# Patient Record
Sex: Male | Born: 1940 | Race: Black or African American | Hispanic: No | Marital: Single | Smoking: Current every day smoker
Health system: Southern US, Community
[De-identification: ages and names within clinical notes are randomized; demographics above are authoritative.]

## PROBLEM LIST (undated history)

## (undated) DIAGNOSIS — E782 Mixed hyperlipidemia: Secondary | ICD-10-CM

## (undated) DIAGNOSIS — K649 Unspecified hemorrhoids: Secondary | ICD-10-CM

## (undated) DIAGNOSIS — R7611 Nonspecific reaction to tuberculin skin test without active tuberculosis: Secondary | ICD-10-CM

## (undated) DIAGNOSIS — F79 Unspecified intellectual disabilities: Secondary | ICD-10-CM

## (undated) DIAGNOSIS — K5790 Diverticulosis of intestine, part unspecified, without perforation or abscess without bleeding: Secondary | ICD-10-CM

## (undated) DIAGNOSIS — R918 Other nonspecific abnormal finding of lung field: Secondary | ICD-10-CM

## (undated) DIAGNOSIS — C3491 Malignant neoplasm of unspecified part of right bronchus or lung: Secondary | ICD-10-CM

## (undated) DIAGNOSIS — F209 Schizophrenia, unspecified: Secondary | ICD-10-CM

## (undated) DIAGNOSIS — Z8719 Personal history of other diseases of the digestive system: Secondary | ICD-10-CM

## (undated) DIAGNOSIS — I10 Essential (primary) hypertension: Secondary | ICD-10-CM

## (undated) DIAGNOSIS — E119 Type 2 diabetes mellitus without complications: Secondary | ICD-10-CM

## (undated) DIAGNOSIS — I493 Ventricular premature depolarization: Secondary | ICD-10-CM

## (undated) DIAGNOSIS — R55 Syncope and collapse: Secondary | ICD-10-CM

## (undated) HISTORY — DX: Ventricular premature depolarization: I49.3

## (undated) HISTORY — DX: Unspecified hemorrhoids: K64.9

## (undated) HISTORY — DX: Malignant neoplasm of unspecified part of right bronchus or lung: C34.91

## (undated) HISTORY — PX: OTHER SURGICAL HISTORY: SHX169

## (undated) HISTORY — DX: Unspecified intellectual disabilities: F79

## (undated) HISTORY — DX: Essential (primary) hypertension: I10

## (undated) HISTORY — DX: Schizophrenia, unspecified: F20.9

## (undated) HISTORY — DX: Type 2 diabetes mellitus without complications: E11.9

## (undated) HISTORY — DX: Syncope and collapse: R55

## (undated) HISTORY — DX: Nonspecific reaction to tuberculin skin test without active tuberculosis: R76.11

## (undated) HISTORY — DX: Diverticulosis of intestine, part unspecified, without perforation or abscess without bleeding: K57.90

## (undated) HISTORY — DX: Other nonspecific abnormal finding of lung field: R91.8

## (undated) HISTORY — DX: Personal history of other diseases of the digestive system: Z87.19

## (undated) HISTORY — DX: Mixed hyperlipidemia: E78.2

---

## 2004-01-23 ENCOUNTER — Ambulatory Visit (HOSPITAL_COMMUNITY): Admission: RE | Admit: 2004-01-23 | Discharge: 2004-01-23 | Payer: Self-pay | Admitting: Cardiology

## 2004-07-10 ENCOUNTER — Inpatient Hospital Stay (HOSPITAL_COMMUNITY): Admission: EM | Admit: 2004-07-10 | Discharge: 2004-07-12 | Payer: Self-pay | Admitting: Emergency Medicine

## 2004-07-10 ENCOUNTER — Ambulatory Visit: Payer: Self-pay | Admitting: Internal Medicine

## 2004-07-11 HISTORY — PX: COLONOSCOPY: SHX174

## 2004-07-11 HISTORY — PX: ESOPHAGOGASTRODUODENOSCOPY: SHX1529

## 2004-12-19 ENCOUNTER — Ambulatory Visit: Payer: Self-pay | Admitting: Internal Medicine

## 2004-12-20 ENCOUNTER — Encounter (HOSPITAL_COMMUNITY): Admission: RE | Admit: 2004-12-20 | Discharge: 2004-12-21 | Payer: Self-pay | Admitting: Internal Medicine

## 2004-12-20 ENCOUNTER — Ambulatory Visit (HOSPITAL_COMMUNITY): Payer: Self-pay | Admitting: Internal Medicine

## 2005-01-10 ENCOUNTER — Ambulatory Visit (HOSPITAL_COMMUNITY): Admission: RE | Admit: 2005-01-10 | Discharge: 2005-01-10 | Payer: Self-pay | Admitting: Internal Medicine

## 2005-01-10 ENCOUNTER — Ambulatory Visit: Payer: Self-pay | Admitting: Internal Medicine

## 2005-01-10 HISTORY — PX: GIVENS CAPSULE STUDY: SHX5432

## 2005-01-23 ENCOUNTER — Ambulatory Visit: Payer: Self-pay | Admitting: Internal Medicine

## 2008-03-29 ENCOUNTER — Ambulatory Visit (HOSPITAL_COMMUNITY): Admission: RE | Admit: 2008-03-29 | Discharge: 2008-03-29 | Payer: Self-pay | Admitting: Gastroenterology

## 2008-03-29 ENCOUNTER — Ambulatory Visit: Payer: Self-pay | Admitting: Gastroenterology

## 2008-03-29 HISTORY — PX: COLONOSCOPY: SHX174

## 2008-08-24 ENCOUNTER — Ambulatory Visit (HOSPITAL_COMMUNITY): Admission: RE | Admit: 2008-08-24 | Discharge: 2008-08-24 | Payer: Self-pay | Admitting: Family Medicine

## 2009-01-01 ENCOUNTER — Emergency Department (HOSPITAL_COMMUNITY): Admission: EM | Admit: 2009-01-01 | Discharge: 2009-01-01 | Payer: Self-pay | Admitting: Emergency Medicine

## 2009-05-11 ENCOUNTER — Emergency Department (HOSPITAL_COMMUNITY): Admission: EM | Admit: 2009-05-11 | Discharge: 2009-05-11 | Payer: Self-pay | Admitting: Emergency Medicine

## 2009-11-07 ENCOUNTER — Ambulatory Visit: Payer: Self-pay | Admitting: Cardiology

## 2009-11-07 DIAGNOSIS — I10 Essential (primary) hypertension: Secondary | ICD-10-CM

## 2009-11-07 DIAGNOSIS — I4949 Other premature depolarization: Secondary | ICD-10-CM | POA: Insufficient documentation

## 2009-11-07 DIAGNOSIS — Z87898 Personal history of other specified conditions: Secondary | ICD-10-CM | POA: Insufficient documentation

## 2009-11-08 ENCOUNTER — Encounter: Payer: Self-pay | Admitting: Cardiology

## 2009-11-14 ENCOUNTER — Ambulatory Visit (HOSPITAL_COMMUNITY): Admission: RE | Admit: 2009-11-14 | Discharge: 2009-11-14 | Payer: Self-pay | Admitting: Cardiology

## 2009-11-14 ENCOUNTER — Ambulatory Visit: Payer: Self-pay | Admitting: Cardiology

## 2009-11-14 ENCOUNTER — Encounter: Payer: Self-pay | Admitting: Cardiology

## 2009-11-16 ENCOUNTER — Ambulatory Visit: Payer: Self-pay | Admitting: Cardiology

## 2009-12-14 ENCOUNTER — Ambulatory Visit: Payer: Self-pay | Admitting: Cardiology

## 2010-07-04 ENCOUNTER — Ambulatory Visit
Admission: RE | Admit: 2010-07-04 | Discharge: 2010-07-04 | Payer: Self-pay | Source: Home / Self Care | Attending: Cardiology | Admitting: Cardiology

## 2010-07-05 ENCOUNTER — Encounter: Payer: Self-pay | Admitting: Adult Health

## 2010-07-11 NOTE — Letter (Signed)
Summary: PROGRESS NOTE [facility] 5.3.11  PROGRESS NOTE [facility] 5.3.11   Imported By: Faythe Ghee 11/08/2009 09:53:56  _____________________________________________________________________  External Attachment:    Type:   Image     Comment:   External Document

## 2010-07-11 NOTE — Letter (Signed)
Summary: LABS 5.4.11  LABS 5.4.11   Imported By: Faythe Ghee 11/08/2009 09:54:30  _____________________________________________________________________  External Attachment:    Type:   Image     Comment:   External Document

## 2010-07-11 NOTE — Assessment & Plan Note (Signed)
Summary: per Dr. Casilda Carls  for hx of DM/HTN/HL/tobacco abuse/tg   Visit Type:  Initial Consult Primary Provider:  Dr. Forest Gleason   History of Present Illness: 70 year old Alejandro [name] referred for cardiology consultation. He has apparently seen Dr. Dietrich Pates in the past based on limited information. He is referred back to the office with a fairly recent episode of syncope. The patient resides at a care facility and has assistance with his medications and other needs. Nearly one month ago, he states that he got out of bed and was ambulating when he suddenly "fell down" and "could not get my memory back" for about "10 seconds." He does not endorse any specific chest pain or palpitations at that time, and denies any obvious sense of lightheadedness preceding this event. He has had no further episodes since that time.  Mr. Brau denies having any prior episodes of syncope. I see no previous documentation of cardiac dysrhythmia or ischemic heart disease, although he certainly has ongoing risk factors. He underwent a Myoview back in August of 2005 that demonstrated an LVEF of 51% with normal myocardial perfusion in the setting of diaphragmatic attenuation.   Mr. Sebo denies having any reproducible exertional chest pain with regular activity. He states the only discomfort he feels as when he "eats too much" he experiences a distended feeling in his abdomen and lower chest. He has no regular sense of palpitations or intermittent dizziness.   Preventive Screening-Counseling & Management  Alcohol-Tobacco     Smoking Status: current  Current Medications (verified): 1)  Risperdal 3 Mg Tabs (Risperidone) .... Take 1 Tab Two Times A Day 2)  Flomax 0.4 Mg Caps (Tamsulosin Hcl) .... Take 1 Tab Daily 3)  Prilosec 20 Mg Cpdr (Omeprazole) .... Take 2 Caps Daily 4)  Avodart 0.5 Mg Caps (Dutasteride) .... Take 1 Tab Daily 5)  Lisinopril-Hydrochlorothiazide 20-12.5 Mg Tabs (Lisinopril-Hydrochlorothiazide) .... Take 1 Tab  Daily 6)  Norvasc 5 Mg Tabs (Amlodipine Besylate) .... Take 1 Tab Daily 7)  Aspir-Low 81 Mg Tbec (Aspirin) .... Take 1 Tab Daily 8)  Metformin Hcl 500 Mg Tabs (Metformin Hcl) .... Take 1 Tab Two Times A Day 9)  Metamucil Multi[employer] Fiber 58.6 % Powd (Psyllium) .Marland Kitchen.. 1 Teaspoon Ful Two Times A Day 10)  Mevacor 40 Mg Tabs (Lovastatin) .... Take 2 Tabs Bedtime 11)  Ventolin Hfa 108 (90 Base) Mcg/act Aers (Albuterol Sulfate) .... 2 Puffs Q4-6hrs or As Needed 12)  Tylenol 325 Mg Tabs (Acetaminophen) .... Take 2 Tabs Q 6hrs As Needed For Pain  Allergies (verified): No Known Drug Allergies  Past History:  Family History: Last updated: 11/07/2009 Cerebrovascular and cardiovascular disease  Social History: Last updated: 11/07/2009 [facility] rest home Retired - Copy Tobacco Use - Yes Alcohol Use - no Prior use of crack cocaine  Past Medical History: Hyperlipidemia Hypertension Schizophrenia History of positive PPD - treated for tuberculosis Diabetes Type 2 Pancolonic diverticulosis Hemorrhoidal and diverticular bleeding  Past Surgical History: Unremarkable  Family History: Cerebrovascular and cardiovascular disease  Social History: [facility] rest home Retired Teacher, music Tobacco Use - Yes Alcohol Use - no Prior use of crack cocaine Smoking Status:  current  Review of Systems  The patient denies anorexia, fever, chest pain, dyspnea on exertion, peripheral edema, prolonged cough, headaches, hemoptysis, melena, and hematochezia.         Otherwise reviewed and negative except as already outlined.  Vital Signs:  Patient profile:   70 year old Alejandro [name] Height:  68 inches Weight:      208 pounds BMI:     31.74 Pulse rate:   85 / minute BP sitting:   122 / 71  (right arm)  Vitals Entered By: Dreama Saa, CNA (Nov 07, 2009 1:12 PM)  Physical Exam  Additional Exam:  Obese Alejandro [name] in no acute distress. HEENT: Conjunctiva and lids normal, oropharynx with poor  dentition. Neck: Supple, elevated JVP or carotid bruits. Lungs: Clear to auscultation, nonlabored. Cardiac: Regular rate and rhythm, soft S4, no S3, no pericardial rub. Abdomen: Protuberant, obese, unable to easily palpate liver edge, bowel sounds present, no tenderness. Extremities: No pitting edema, distal pulses 1+. Skin: Warm and dry. Musculoskeletal: No gross deformities. Neuropsychiatric: Alert and oriented x3. Affect grossly appropriate to situation.   Impression & Recommendations:  Problem # 1:  SYNCOPE AND COLLAPSE (ICD-780.2)  Single episode of apparent syncope based on patient description. This reportedly occurred after standing up from a supine position. Transient orthostasis is a possibility, as is cardiac dysrhythmia. Mr. Hilligoss has risk factors for underlying ischemic heart disease, although does not endorse any regular anginal symptoms or unusual shortness of breath. Today's electrocardiogram does show frequent PVCs. We discussed these issues, and the plan at this point will be to proceed with a 2-D echocardiogram to document cardiac structure and function, and also place a 24-hour Holter monitor. I will then have him return to the office to discuss the results, and any potential additional evaluation. At this point no specific medication adjustments were made.  His updated medication list for this problem includes:    Lisinopril-hydrochlorothiazide 20-12.5 Mg Tabs (Lisinopril-hydrochlorothiazide) .Marland Kitchen... Take 1 tab daily    Norvasc 5 Mg Tabs (Amlodipine besylate) .Marland Kitchen... Take 1 tab daily    Aspir-low 81 Mg Tbec (Aspirin) .Marland Kitchen... Take 1 tab daily  Orders: Holter Monitor (Holter Monitor) 2-D Echocardiogram (2D Echo)  Problem # 2:  OTHER PREMATURE BEATS (ICD-427.69)  PVCs noted on electrocardiogram. Available old tracings also show at least occasional PVCs.  His updated medication list for this problem includes:    Lisinopril-hydrochlorothiazide 20-12.5 Mg Tabs  (Lisinopril-hydrochlorothiazide) .Marland Kitchen... Take 1 tab daily    Norvasc 5 Mg Tabs (Amlodipine besylate) .Marland Kitchen... Take 1 tab daily    Aspir-low 81 Mg Tbec (Aspirin) .Marland Kitchen... Take 1 tab daily  Orders: 2-D Echocardiogram (2D Echo) Holter Monitor (Holter Monitor)  Problem # 3:  HYPERTENSION, BENIGN ESSENTIAL (ICD-401.1)  Blood pressure well controlled today.  His updated medication list for this problem includes:    Lisinopril-hydrochlorothiazide 20-12.5 Mg Tabs (Lisinopril-hydrochlorothiazide) .Marland Kitchen... Take 1 tab daily    Norvasc 5 Mg Tabs (Amlodipine besylate) .Marland Kitchen... Take 1 tab daily    Aspir-low 81 Mg Tbec (Aspirin) .Marland Kitchen... Take 1 tab daily  Patient Instructions: 1)  Your physician recommends that you schedule a follow-up appointment in: 3 to 4 weeks 2)  Your physician recommends that you continue on your current medications as directed. Please refer to the Current Medication list given to you today. 3)  Your physician has requested that you have an echocardiogram.  Echocardiography is a painless test that uses sound waves to create images of your heart. It provides your doctor with information about the size and shape of your heart and how well your heart's chambers and valves are working.  This procedure takes approximately one hour. There are no restrictions for this procedure. 4)  Your physician has recommended that you wear a holter monitor.  Holter monitors are medical devices that record the heart's electrical activity.  Doctors most often use these monitors to diagnose arrhythmias. Arrhythmias are problems with the speed or rhythm of the heartbeat. The monitor is a small, portable device. You can wear one while you do your normal daily activities. This is usually used to diagnose what is causing palpitations/syncope (passing out).

## 2010-07-11 NOTE — Letter (Signed)
Summary: EKG 5.3.11  EKG 5.3.11   Imported By: Faythe Ghee 11/08/2009 09:55:19  _____________________________________________________________________  External Attachment:    Type:   Image     Comment:   External Document

## 2010-07-11 NOTE — Assessment & Plan Note (Signed)
Summary: 3-4 wk f/u per checkout on 11/07/09/tg   Visit Type:  Follow-up Primary Provider:  Dr. Forest Gleason   History of Present Illness: 70 year old male presents for followup. He was seen recently in late May for evaluation of syncope. Subsequent testing including echocardiogram and Holter monitor are reviewed below.  Since his last visit, he has had no syncope, no falls, no sense of palpitations, and no dizziness. Furthermore, he denies any exertional chest pain to suggest angina.  I reviewed the findings of his results today, and we discussed the implications. While we did not see any sustained ventricular events that would explain syncope, this remains a possibility. In light of his low normal LVEF, medical therapy and observation seem to make the most sense at this time. He was comfortable with this. He lives in a rest home, and does not drive.  Current Medications (verified): 1)  Risperdal 3 Mg Tabs (Risperidone) .... Take 1 Tab Two Times A Day 2)  Flomax 0.4 Mg Caps (Tamsulosin Hcl) .... Take 1 Tab Daily 3)  Prilosec 20 Mg Cpdr (Omeprazole) .... Take 2 Caps Daily 4)  Avodart 0.5 Mg Caps (Dutasteride) .... Take 1 Tab Daily 5)  Lisinopril-Hydrochlorothiazide 20-12.5 Mg Tabs (Lisinopril-Hydrochlorothiazide) .... Take 1 Tab Daily 6)  Norvasc 5 Mg Tabs (Amlodipine Besylate) .... Take 1 Tab Daily 7)  Aspir-Low 81 Mg Tbec (Aspirin) .... Take 1 Tab Daily 8)  Metformin Hcl 500 Mg Tabs (Metformin Hcl) .... Take 1 Tab Two Times A Day 9)  Metamucil Multihealth Fiber 58.6 % Powd (Psyllium) .Marland Kitchen.. 1 Teaspoon Ful Two Times A Day 10)  Mevacor 40 Mg Tabs (Lovastatin) .... Take 2 Tabs Bedtime 11)  Ventolin Hfa 108 (90 Base) Mcg/act Aers (Albuterol Sulfate) .... 2 Puffs Q4-6hrs or As Needed 12)  Tylenol 325 Mg Tabs (Acetaminophen) .... Take 2 Tabs Q 6hrs As Needed For Pain 13)  Ambien 10 Mg Tabs (Zolpidem Tartrate) .... Take 1 Tab At Bedtime 14)  Toprol Xl 25 Mg Xr24h-Tab (Metoprolol Succinate) .... Take  1 Tablet By Mouth Once Daily  Allergies (verified): No Known Drug Allergies  Past History:  Past Medical History: Last updated: 11/07/2009 Hyperlipidemia Hypertension Schizophrenia History of positive PPD - treated for tuberculosis Diabetes Type 2 Pancolonic diverticulosis Hemorrhoidal and diverticular bleeding  Social History: Last updated: 11/07/2009 [facility] rest home Retired - Copy Tobacco Use - Yes Alcohol Use - no Prior use of crack cocaine  Review of Systems  The patient denies anorexia, fever, chest pain, syncope, dyspnea on exertion, peripheral edema, melena, and hematochezia.         Otherwise reviewed and negative except as outlined.  Vital Signs:  Patient profile:   70 year old male Weight:      208 pounds Pulse rate:   102 / minute BP sitting:   117 / 81  (right arm)  Vitals Entered By: Dreama Saa, CNA (December 14, 2009 1:45 PM)  Physical Exam  Additional Exam:  Obese male in no acute distress. HEENT: Conjunctiva and lids normal, oropharynx with poor dentition. Neck: Supple, elevated JVP or carotid bruits. Lungs: Clear to auscultation, nonlabored. Cardiac: Regular rate and rhythm, soft S4, no S3, no pericardial rub. Abdomen: Protuberant, obese, unable to easily palpate liver edge, bowel sounds present, no tenderness. Extremities: No pitting edema, distal pulses 1+. Skin: Warm and dry. Musculoskeletal: No gross deformities. Neuropsychiatric: Alert and oriented x3. Affect grossly appropriate to situation.   Echocardiogram  Procedure date:  11/14/2009  Findings:  Study Conclusions    - Left ventricle: BorderlineLVH. Systolic function was low normal.     The estimated ejection fraction was in the range of 50% to 55%.     Wall motion was normal; there were no regional wall motion     abnormalities.   - Aortic valve: Mildly calcified annulus. Trileaflet; normal     thickness leaflets.   - Atrial septum: No defect or patent  foramen ovale was identified.  Holter Monitor  Procedure date:  11/19/2009  Findings:      CLINICAL DATA:  A 70 year old gentleman with syncope. 1. Continuous electrocardiographic recording was maintained for 23     hours and 21 minutes during which the predominant rhythm was normal     sinus.  Sinus bradycardia occurred with the lowest rate recorded     being 39 bpm.  Sinus tachycardia was present as well, reaching a     peak rate of 140 bpm. 2. Very frequent premature ventricular depolarizations were noted,     occurring at an average rate of approximately 500 per hour.     Frequency was decreased in the hours during which sleep would be     expected.  There were some paired PVCs as well as a few three beat     runs of ventricular tachycardia. 3. Much less frequent supraventricular premature complexes were     identified, occurring at an average rate of 25 per hour. 4. No significant ST-segment elevation or depression was identified. 5. A complete diary of activity was returned.  No symptoms were     reported.   IMPRESSION:  Abnormal continuous electrocardiographic recording notable for very frequent premature ventricular depolarizations, some paired premature ventricular contractions and a handful of minimal episodes of ventricular tachycardia.  No symptoms were associated with rhythm disturbance.  Other findings as noted.  Impression & Recommendations:  Problem # 1:  SYNCOPE AND COLLAPSE (ICD-780.2)  No recurrence. Evaluation including echocardiogram and Holter monitor reviewed, demonstrating low-normal LVEF, and frequent PVCs, with some couplets and brief asymptomatic 3 beat episodes. We discussed the implications and options, and at this time medical therapy with observation will be pursued. Plan to add Toprol-XL 25 mg daily to his present regimen. Clinicall followup in 6 months, sooner if needed.  His updated medication list for this problem includes:     Lisinopril-hydrochlorothiazide 20-12.5 Mg Tabs (Lisinopril-hydrochlorothiazide) .Marland Kitchen... Take 1 tab daily    Norvasc 5 Mg Tabs (Amlodipine besylate) .Marland Kitchen... Take 1 tab daily    Aspir-low 81 Mg Tbec (Aspirin) .Marland Kitchen... Take 1 tab daily    Toprol Xl 25 Mg Xr24h-tab (Metoprolol succinate) .Marland Kitchen... Take 1 tablet by mouth once daily  Problem # 2:  HYPERTENSION, BENIGN ESSENTIAL (ICD-401.1)  Blood pressure well controlled today.  His updated medication list for this problem includes:    Lisinopril-hydrochlorothiazide 20-12.5 Mg Tabs (Lisinopril-hydrochlorothiazide) .Marland Kitchen... Take 1 tab daily    Norvasc 5 Mg Tabs (Amlodipine besylate) .Marland Kitchen... Take 1 tab daily    Aspir-low 81 Mg Tbec (Aspirin) .Marland Kitchen... Take 1 tab daily    Toprol Xl 25 Mg Xr24h-tab (Metoprolol succinate) .Marland Kitchen... Take 1 tablet by mouth once daily  Patient Instructions: 1)  Your physician recommends that you schedule a follow-up appointment in: 6 months 2)  Your physician has recommended you make the following change in your medication: start taking Toprol XL 25mg  by mouth once daily  Prescriptions: TOPROL XL 25 MG XR24H-TAB (METOPROLOL SUCCINATE) take 1 tablet by mouth once daily  #90 x  1   Entered by:   Larita Fife Via LPN   Authorized by:   Loreli Slot, MD, [facility]   Signed by:   Larita Fife Via LPN on 16/03/9603   Method used:   Electronically to        [facility] (retail)       [address]       [place], [place]  [address]       Ph: [phone]       Fax: [phone]   RxID:   [id number]

## 2010-07-12 NOTE — Assessment & Plan Note (Signed)
Summary: 6 mth f/u per checkout on 12/14/09/tg   Visit Type:  Follow-up Primary Provider:  Dr. Forest Gleason  CC:  no cardiology complaints.  History of Present Illness: 70 year old male presents for followup. He was seen back in July 2011. He was followed at that time after an episode of syncope. Evaluation including echocardiogram and Holter monitor reviewed, demonstrating low-normal LVEF, and frequent PVCs, with some couplets and brief asymptomatic 3 beat episodes. Observation and medical therapy was recommended.  He has no complaints of syncope, palpatations or chest pain. He walks several "laps" around the facility every day. Blood glucose has been monitored weekly. Recent labs drawn in Sept 2011 have been documented as WNL.    Current Medications (verified): 1)  Risperdal 3 Mg Tabs (Risperidone) .... Take 1 Tab Two Times A Day 2)  Flomax 0.4 Mg Caps (Tamsulosin Hcl) .... Take 1 Tab Daily 3)  Avodart 0.5 Mg Caps (Dutasteride) .... Take 1 Tab Daily 4)  Lisinopril-Hydrochlorothiazide 20-12.5 Mg Tabs (Lisinopril-Hydrochlorothiazide) .... Take 1 Tab Daily 5)  Norvasc 5 Mg Tabs (Amlodipine Besylate) .... Take 1 Tab Daily 6)  Aspir-Low 81 Mg Tbec (Aspirin) .... Take 1 Tab Daily 7)  Metformin Hcl 500 Mg Tabs (Metformin Hcl) .... Take 1 Tab Two Times A Day 8)  Metamucil Multihealth Fiber 58.6 % Powd (Psyllium) .Marland Kitchen.. 1 Teaspoon Ful Two Times A Day 9)  Ventolin Hfa 108 (90 Base) Mcg/act Aers (Albuterol Sulfate) .... 2 Puffs Q4-6hrs or As Needed 10)  Tylenol 325 Mg Tabs (Acetaminophen) .... Take 2 Tabs Q 6hrs As Needed For Pain 11)  Ambien 10 Mg Tabs (Zolpidem Tartrate) .... Take 1/2 Tab At Bedtime 12)  Toprol Xl 25 Mg Xr24h-Tab (Metoprolol Succinate) .... Take 1 Tablet By Mouth Once Daily 13)  Lovastatin 40 Mg Tabs (Lovastatin) .... Take 2 Tab Daily 14)  Omeprazole 20 Mg Cpdr (Omeprazole) .... Take 1 Tab Daily 15)  Trazodone Hcl 50 Mg Tabs (Trazodone Hcl) .... Take 2 Tabs At Bedtime  Allergies  (verified): No Known Drug Allergies  Past History:  Social History: Last updated: 11/07/2009 [facility] rest home Retired - Copy Tobacco Use - Yes Alcohol Use - no Prior use of crack cocaine  Past medical history reviewed for relevance to current acute and chronic problems.  Past Medical History: Reviewed history from 11/07/2009 and no changes required. Hyperlipidemia Hypertension Schizophrenia History of positive PPD - treated for tuberculosis Diabetes Type 2 Pancolonic diverticulosis Hemorrhoidal and diverticular bleeding  Review of Systems       All other systems have been reviewed and are negative unless stated above.   Vital Signs:  Patient profile:   70 year old male Weight:      217 pounds Pulse rate:   81 / minute BP sitting:   134 / 81  (left arm)  Vitals Entered By: Dreama Saa, CNA (July 04, 2010 1:09 PM)  Physical Exam  General:  Well developed, well nourished, in no acute distress.normal appearance.   Head:  normocephalic and atraumatic Eyes:  PERRLA/EOM intact; conjunctiva and lids normal. Lungs:  Clear bilaterally to auscultation and percussion. Heart:  Non-displaced PMI, chest non-tender; regular rate and rhythm, S1, S2 without murmurs, rubs or gallops. Occasional extra systole. Carotid upstroke normal, no bruit. Normal abdominal aortic size, no bruits. Femorals normal pulses, no bruits. Pedals normal pulses. No edema, no varicosities. Abdomen:  Obese, NT 2+ BS. Msk:  Back normal, normal gait. Muscle strength and tone normal. Pulses:  pulses normal in all 4  extremities Extremities:  No clubbing or cyanosis. Neurologic:  Alert and oriented x 3. Psych:  Normal affect.   EKG  Procedure date:  07/04/2010  Findings:      SR with occasional PVC rate 80 bpm  Prior Report Reviewed for Echocardiogram:  Findings: 11/14/2009  Study Conclusions    - Left ventricle: BorderlineLVH. Systolic function was low normal.     The estimated  ejection fraction was in the range of 50% to 55%.     Wall motion was normal; there were no regional wall motion     abnormalities.   - Aortic valve: Mildly calcified annulus. Trileaflet; normal     thickness leaflets.   - Atrial septum: No defect or patent foramen ovale was identified.  Comments:    Impression & Recommendations:  Problem # 1:  SYNCOPE AND COLLAPSE (ICD-780.2) No complaints of recurrent syncope. He is on low dose toprol with good heart rate control.  No complaints of palpations.  Continue medical management. His updated medication list for this problem includes:    Lisinopril-hydrochlorothiazide 20-12.5 Mg Tabs (Lisinopril-hydrochlorothiazide) .Marland Kitchen... Take 1 tab daily    Norvasc 5 Mg Tabs (Amlodipine besylate) .Marland Kitchen... Take 1 tab daily    Aspir-low 81 Mg Tbec (Aspirin) .Marland Kitchen... Take 1 tab daily    Toprol Xl 25 Mg Xr24h-tab (Metoprolol succinate) .Marland Kitchen... Take 1 tablet by mouth once daily  His updated medication list for this problem includes:    Lisinopril-hydrochlorothiazide 20-12.5 Mg Tabs (Lisinopril-hydrochlorothiazide) .Marland Kitchen... Take 1 tab daily    Norvasc 5 Mg Tabs (Amlodipine besylate) .Marland Kitchen... Take 1 tab daily    Aspir-low 81 Mg Tbec (Aspirin) .Marland Kitchen... Take 1 tab daily    Toprol Xl 25 Mg Xr24h-tab (Metoprolol succinate) .Marland Kitchen... Take 1 tablet by mouth once daily  Problem # 2:  HYPERTENSION, BENIGN ESSENTIAL (ICD-401.1) BP is well controlled at present.  Will obtain copy of next labs to be drawn in March 2012 to ascertain renal fx on lisinopril.  No medication changes at this time. Will see him in 6 months unless he becomes symptomatic. His updated medication list for this problem includes:    Lisinopril-hydrochlorothiazide 20-12.5 Mg Tabs (Lisinopril-hydrochlorothiazide) .Marland Kitchen... Take 1 tab daily    Norvasc 5 Mg Tabs (Amlodipine besylate) .Marland Kitchen... Take 1 tab daily    Aspir-low 81 Mg Tbec (Aspirin) .Marland Kitchen... Take 1 tab daily    Toprol Xl 25 Mg Xr24h-tab (Metoprolol succinate) .Marland Kitchen... Take 1  tablet by mouth once daily

## 2010-09-11 LAB — URINALYSIS, ROUTINE W REFLEX MICROSCOPIC
Nitrite: NEGATIVE
Protein, ur: NEGATIVE mg/dL
Urobilinogen, UA: 0.2 mg/dL (ref 0.0–1.0)

## 2010-09-11 LAB — GLUCOSE, CAPILLARY
Glucose-Capillary: 121 mg/dL — ABNORMAL HIGH (ref 70–99)
Glucose-Capillary: 61 mg/dL — ABNORMAL LOW (ref 70–99)

## 2010-09-11 LAB — URINE CULTURE
Colony Count: NO GROWTH
Culture: NO GROWTH

## 2010-09-11 LAB — URINE MICROSCOPIC-ADD ON

## 2010-10-12 ENCOUNTER — Encounter: Payer: Self-pay | Admitting: Cardiology

## 2010-10-23 NOTE — Op Note (Signed)
Alejandro [name], Alejandro [name]                 ACCOUNT NO.:  [id number]   MEDICAL RECORD NO.:  [id number]          PATIENT TYPE:  AMB   LOCATION:  DAY                           FACILITY:  [facility]   PHYSICIAN:  Kassie Mends, M.D.      DATE OF BIRTH:  1941-05-22   DATE OF PROCEDURE:  03/29/2008  DATE OF DISCHARGE:                               OPERATIVE REPORT   REFERRING PHYSICIAN:  Dr. Claretta Fraise   PROCEDURE:  Colonoscopy.   INDICATION FOR EXAM:  Alejandro [name] is a 70 year old who presents with  intermittent rectal bleeding.   FINDINGS:  1. Large mouth ascending colon and sigmoid colon diverticula which      were frequent.  Otherwise, no polyps, masses, inflammatory changes,      or AVM seen.  2. Small internal hemorrhoids, otherwise normal retroflexed view of      the rectum.   DIAGNOSIS:  Intermittent rectal bleeding secondary to hemorrhoids   RECOMMENDATIONS:  1. Screening colonoscopy in 10 years.  2. He should follow a high fiber diet.  He was given a handout on high-      fiber diet, diverticulosis, and hemorrhoids.   MEDICATIONS:  1. Demerol 75 mg IV.  2. Versed 5 mg IV.   PROCEDURE TECHNIQUE:  Physical exam was performed.  Informed consent was  obtained from the patient after explaining the benefits, risks, and  alternatives to the procedure.  The patient was connected to the monitor  and placed in left lateral position.  Continuous oxygen was provided by  nasal cannula.  IV medicine administered through an indwelling cannula.  After administration of sedation and rectal exam, the patient's rectum  was intubated  and the scope was advanced under direct visualization to the cecum.  The  scope was removed slowly by carefully examine the color, texture,  anatomy, and integrity of mucosa on the way out.  The patient was  recovered in endoscopy and discharged home in satisfactory condition.      Kassie Mends, M.D.  Electronically Signed     SM/MEDQ  D:  03/29/2008  T:   03/29/2008  Job:  [id number]   cc:   [place], Dr.

## 2010-10-26 NOTE — H&P (Signed)
NAMEWENDELIN, BRADT                 ACCOUNT NO.:  [id number]   MEDICAL RECORD NO.:  [id number]         PATIENT TYPE:  AMB   LOCATION:                                FACILITY:  [facility]   PHYSICIAN:  Lionel December, M.D.    DATE OF BIRTH:  1940/08/03   DATE OF ADMISSION:  DATE OF DISCHARGE:  LH                                HISTORY & PHYSICAL   CHIEF COMPLAINT:  Significant anemia, GI bleed.   HISTORY OF PRESENT ILLNESS:  Mr. Costabile is a 70 year old African-American  male with a history of diverticular bleed.  He presents to the office today  with significant anemia with a hemoglobin of 6.1, significant fatigue and  weakness.  He is on iron 325 mg t.i.d.  His caregiver reports he had a  transfusion about a month ago; I do not have any records regarding this.  He  was hospitalized around the first of February with a diverticular bleed.  He  underwent a colonoscopy and EGD by Dr. Karilyn Cota.  He was found to have  pancolonic diverticulosis.  Colon was full of coffee-grounds material but no  active bleeding was noted.  He did have small external hemorrhoids, small  sliding hiatal hernia and bulbar duodenitis without stigmata of bleeding.  He has been on PPI therapy with Prilosec b.i.d.  He also is on aspirin 81 mg  daily as well as Naprosyn 500 mg t.i.d.  He denies any abdominal pain or  melena currently, although he did have some dark red stools, 3 episodes,  about 2-3 weeks ago.  He denies any chest pain, palpitations or shortness of  breath.  He denies any abdominal pain.   Recent CBC from December 17, 2004 from Dr. Jorene Guest at [facility]  reveals hemoglobin of 6.1, hematocrit 20.4 and MCV 73, platelets within  normal range.   PAST MEDICAL HISTORY:  Significant for schizophrenia, diabetes mellitus,  hypertension, hyperlipidemia, GI bleeding with colonoscopy and EGD as  described, felt to be diverticular in nature in February 2006.  History of  tuberculosis status post treatment.   CURRENT MEDICATIONS:  1.  Metformin 500 mg daily.  2.  Norvasc 5 mg daily.  3.  Aspirin 81 mg daily.  4.  Mevacor 40 mg b.i.d.  5.  Risperdal 3 mg in a.m. and 2 at bedtime.  6.  Metamucil 1 tsp b.i.d.  7.  Klonopin 1 mg q.h.s.  8.  Betamethasone cream 0.5% p.r.n.  9.  Naprosyn 500 mg t.i.d.  10. Prilosec 20 mg b.i.d.  11. Ferrous sulfate 325 mg t.i.d. after meals.   ALLERGIES:  No known drug allergies.   FAMILY HISTORY:  Noncontributory.   SOCIAL HISTORY:  He is disabled.  He resides at [facility].  He  has a history of tobacco use.  He denies any current tobacco, alcohol or  drug use.   REVIEW OF SYSTEMS:  CONSTITUTIONAL:  Weight relatively stable, is  complaining of fatigue.  See HPI.  CARDIOVASCULAR:  Denies any chest pain or  palpitations.  PULMONARY:  Denies any shortness of breath,  dyspnea, cough or  hemoptysis.  GI:  See HPI.   PHYSICAL EXAMINATION:  VITAL SIGNS:  Weight 228.5 pounds, height 67-1/2  inches, temperature 98.3, blood pressure 142/86, pulse 72.  GENERAL:  Mr. Berent is an alert and oriented African-American male who is in  no acute distress.  He is accompanied by his caregiver today.  HEENT:  Sclerae are clear, nonicteric.  Conjunctivae are pale.  Oropharynx  is pale.  NECK:  Supple without any mass or thyromegaly.  CHEST:  Heart, rate is regular.  He does have a 2/6 systolic ejection murmur  noted.  LUNGS:  Crackles at the left base, no acute distress.  ABDOMEN:  Obese with positive bowel sounds x 4.  He does have diastasis  rectus, no bruits.  Abdomen is soft, nontender and nondistended without  palpable mass or hepatosplenomegaly.  No returns or guarding.  EXTREMITIES:  He has 1+ pitting extremities lower posterior tibial and pedal  edema.  RECTAL:  No external lesions visualized.  Good sphincter tone.  Small amount  of dark brown still is obtained from the vault which is heme positive.   LABORATORY STUDIES:  December 17, 2004 studies reveal  hemoglobin of 6.1,  hematocrit 20.4 and MCV 73, platelets 452, WBCs 9.5.   IMPRESSION:  Mr. Hopping is a 70 year old African-American male with a history  of diverticular bleed presents with significant anemia and GI bleeding.  Last hemoglobin was 6.1.  Apparently he has had a transfusion about a month  or so ago.  I do not have records on this.  He is symptomatic as far as  fatigue and some weakness.  He has been started on iron t.i.d.  He resides  at [facility].  I have discussed this case with Dr. Karilyn Cota,  and we will proceed with a transfusion tomorrow at specialty clinic.  Once  his hemoglobin is stabilized we can proceed with further workup to determine  the cause of his gastrointestinal bleeding.  Of course recurrent  diverticular bleed remains in the differential as does small-bowel AVM and  also NSAID-induced injury.  He is on a significant amount NSAIDs making him  at risk for GI bleeding.   RECOMMENDATIONS:  1.  Discontinue aspirin and Naprosyn.  2.  He is to continue Prilosec b.i.d.  3.  Continue iron t.i.d.  4.  We will schedule him for 2 units of packed red blood cells at specialty      clinic tomorrow, premedicate with Tylenol 650 p.o. prior to transfusion,      Benadryl 25 mg p.o. prior to transfusion.  We will give 20 mg of Lasix      between the first and second transfusion given his left lung base      crackles, murmur and 1+ pitting edema today on exam, although there is      no known history of CHF.  Labs prior to the transfusion to include CBC,      iron TIBC, % sat ferritin, folate, B12 and CMP.  5.  He will receive a post transfusion H&H to be faxed to our office.  6.  This case was discussed with Dr. Karilyn Cota, and he is in agreement with the      above plan.  7.  We will proceed with further workup post transfusion and consider      possible Given capsule study in the near future.      KC/MEDQ  D:  12/19/2004  T:  12/19/2004  Job:  [id number]

## 2010-10-26 NOTE — Discharge Summary (Signed)
Alejandro [name], Alejandro [name]                 ACCOUNT NO.:  [id number]   MEDICAL RECORD NO.:  [id number]          PATIENT TYPE:  INP   LOCATION:  A219                          FACILITY:  [facility]   PHYSICIAN:  Osvaldo Shipper, MD     DATE OF BIRTH:  1941-03-19   DATE OF ADMISSION:  07/10/2004  DATE OF DISCHARGE:  02/02/2006LH                                 DISCHARGE SUMMARY   ADDENDUM:  Please note the patient was actually discharged on July 12, 2004 to [facility].   After the patient was discharged, the results of his blood test came back.  It was noted that the patient's H. pylori IgG antibody was significantly  positive, suggesting previous exposure or active infection.  In view of his  recent GI bleed, it is very important to treat this infection.  Hence, I  faxed prescriptions over to the nursing facility to start the patient on:   1.  Protonix 40 mg p.o. b.i.d. for 2 weeks followed by 40 mg p.o. daily for      about 3 months.  2.  Amoxicillin 1 gm b.i.d. for 2 weeks and  3.  Clarithromycin 500 mg b.i.d. for 2 weeks.   The patient's nurse was contacted in the facility, prescriptions were faxed,  and instructions given to give these medications.      GK/MEDQ  D:  07/16/2004  T:  07/16/2004  Job:  [id number]   cc:   R. Roetta Sessions, M.D.  [address]  [place] [id number]

## 2010-10-26 NOTE — Discharge Summary (Signed)
Alejandro [name], Alejandro [name]                 ACCOUNT NO.:  [id number]   MEDICAL RECORD NO.:  [id number]          PATIENT TYPE:  INP   LOCATION:  A219                          FACILITY:  [facility]   PHYSICIAN:  Osvaldo Shipper, MD     DATE OF BIRTH:  [birth year]   DATE OF ADMISSION:  07/10/2004  DATE OF DISCHARGE:  02/02/2006LH                                 DISCHARGE SUMMARY   PRIMARY CARE PHYSICIANS:  Primary care physician is Dr. Reynolds Bowl;  Cardiologist is Dr. [place] Bing.   Discharge Diagnosis:  1.  Diverticular GI Bleed  2.  Duodenitis  3.  Type 2 Diabetes  4.  Hypertension  5.  Schizophrenia   BRIEF HISTORY OF PRESENT ILLNESS:  The patient was admitted on July 10, 2004 with chief complaint of dizziness and rectal bleeding.  The patient is  a 70 year old African-American male who is a resident of an assisted living  facility with history of hypertension, diabetes and schizophrenia who  basically felt dizzy upon standing and developed bright red blood per  rectum.  The patient was also documented to have low blood pressure,  subsequently went to the emergency room.  In the emergency room the patient  was fluid resuscitated.  The patient was also transfused two units of blood  for low hemoglobin of 8.3.   HOSPITAL COURSE:  During the hospitalization gastroenterology service was  involved in the patient's care.  The gastroenterology service did an  elective colonoscopy on this patient which revealed pancolonic  diverticulosis with no active bleeding that was noted.  Also seen were small  external hemorrhoids.  A small sliding hiatal hernia and bulboduodenitis was  also noticed upon doing EGD.  Conclusion at the end of the study was the  patient must have bled from right colonic diverticula.   The patient did well during his hospital stay.  His blood pressure improved.  His dizziness did not recur.  The patient did not have any further episodes  of gastrointestinal bleeding.   His hemoglobin remained stable during the  hospital stay with no further active bleeding.  He did require two units of  blood at the time of admission.  On the day of discharge the patient was  asymptomatic and feeling much better and he was ready to go back to his  assisted living facility.   PROCEDURES PERFORMED:  As I mentioned above, he underwent a colonoscopy and  an esophagogastroduodenoscopy which basically showed diverticulosis, small  external hemorrhoids and bulboduodenitis.  No site of active bleeding was  noted at any of the sites.   CLINICAL DATA:  Other studies that the patient underwent included chest x-  ray which did not show any evidence of acute disease.  Among his blood work,  as I mentioned, the patient's hemoglobin at the time of discharge is 8.2 but  it has been stable over the past two to three readings and the patient has  had no further bleeding.  His white count has remained normal.  His  chemistry profile has been unremarkable during this admission.  OTHER ISSUES:  #1:  DIABETES:  For his diabetes the patient was on metformin  at the assisted living facility which was discontinued because the patient  was kept NPO for the most part of the admission and was on intravenous  fluids alone.  The metformin may be restarted upon discharge.   #2:  HYPERTENSION:  Again, the patient's antihypertensive medications were  held because the blood pressure was low at the time of admission and these  may be restarted slowly.  Plan will be to restart Norvasc initially and then  in the assisted living facility, based on his blood pressure, his other  medications may be restarted.  Plan will also be to not give the patient a  full dose of aspirin because he doesn't have any history of cardiac disease.  The patient may, however, take a baby aspirin every day.   #3:  SCHIZOPHRENIA:  The patient remained stable during this admission with  no aberrant psychiatric manifestations.   We continued him on his Risperdal  and his behavior was stable.   CONDITION ON DISCHARGE:  Stable.   MEDICATIONS AT THE TIME OF DISCHARGE:  1.  Protonix 40 mg orally once daily.  2.  Risperdal 3 mg orally before each meal as well as 7.5 mg orally at      bedtime.  3.  Metamucil, patient to take one to two teaspoons orally twice daily.  4.  Norvasc 5 mg p.o. once daily.  5.  Aspirin 81 mg p.o. once daily.  6.  Metformin 500 mg p.o. once daily.  7.  Mevacor 80 mg p.o. every evening.   Please note the dose of Norvasc has been reduced from his dose of 10 mg to 5  mg.  This may be increased to 10 mg based on his blood pressure.  Also note  the patient is not being discharged on the lisinopril and  hydrochlorothiazide which he was taking prior to this admission.  These  medications may be restarted at the assisted living facility once his blood  pressure is noted to be getting into the hypertensive range.  The patient is  to avoid any kind of NSAID use.   LABORATORY DATA:  The only lab result we are awaiting at this time is an H.  Pylori serology based on which further treatment may be recommended.   DIETARY RECOMMENDATIONS:  Include 4 gram sodium, 1800 calorie modified  carbohydrate diet with high fiber.   ACTIVITY:  There are no restrictions.  He may resume his previous level of  activity.   FOLLOW UP:  The patient is to see his primary care physician, Dr. Reynolds Bowl within one to two weeks after this discharge.   DISCUSSION:  The patient has been counseled about his dietary intake. He has  been counseled about blood pressure as well as diabetes management.      GK/MEDQ  D:  07/12/2004  T:  07/12/2004  Job:  [id number]   cc:   [place] Bing, M.D.   Lionel December, M.D.  [address]    [place] [address]

## 2010-10-26 NOTE — Consult Note (Signed)
NAMESERGI, Alejandro [name]                 ACCOUNT NO.:  [id number]   MEDICAL RECORD NO.:  [id number]          PATIENT TYPE:  INP   LOCATION:  A219                          FACILITY:  [facility]   PHYSICIAN:  Lionel December, M.D.    DATE OF BIRTH:  [birth year]   DATE OF CONSULTATION:  07/10/2004  DATE OF DISCHARGE:                                   CONSULTATION   CONSULTING PHYSICIAN:  Lionel December, M.D.   REASON FOR CONSULTATION:  GI bleed, anemia.   HISTORY OF PRESENT ILLNESS:  Alejandro [name] is a 70 year old African American  male who was admitted to Dr. Blair Dolphin service via the emergency room where  he was brought in with a 3 to 4-day history of passing blood per rectum.  He  is staying at a rest home.  Apparently, staff was trying to make an  appointment for him to be seen but this has not happened yet.  He has been  feeling dizzy and lightheaded.  On a few occasions he thought he was going  to pass out but he sat down and felt better.  Yesterday he had some nausea  and heaving but did not vomit.  He has felt bloated today but denies  abdominal pain.  He denies frequent heartburn, dysphagia, chronic cough or  hoarseness.  His bowels generally move regularly.  He denies a history of  peptic ulcer disease or recent weight loss.  In the emergency room he was  noted to have a hemoglobin of 8.3 and hematocrit of 24.2.  He is receiving  his first unit of PRBCs.   He is presently on:  1.  Protonix 40 mg IV q.12h.  2.  Risperdal 3 mg in the a.m., 7.5 mg in the p.m.   His other usual medications include:  1.  Mevacor 80 mg every day.  2.  Norvasc 10 mg every day.  3.  ASA 325 mg every day.  4.  Metformin 500 mg every day.  5.  Lisinopril 20 mg every day.  6.  HCTZ 25 mg every day.   PAST MEDICAL HISTORY:  1.  History of schizophrenia.  2.  Diabetes mellitus.  3.  Hypertension.  4.  Hyperlipidemia.  5.  Chart indicates a history of tuberculosis but further details are not      available.   ALLERGIES:  None known.   Dr. Orvan Falconer also put him on a nicotine patch 21 mg to the skin daily.   FAMILY HISTORY:  Noncontributory.  Father died of CVA and mother had CVA as  well as heart disease.  He has two brothers and two sisters who are in good  health.   SOCIAL HISTORY:  He is disabled.  He used to do yard work before he got  sick.  He has been staying at a rest home for nine years.  He was originally  from [place], [place].  He smokes about a pack a day which he has done  for many years, and he has a history of ethanol use but has not had in the  last several years.   PHYSICAL EXAMINATION:  GENERAL:  Pleasant, mildly obese, African American  male who is in no acute distress.  VITAL SIGNS:  Admission weight 233.6 pounds and he is 71 inches tall.  Pulse  is 90 per minute, blood pressure 114/68, temp is 97.9, respiratory rate is  20.  HEENT:  Conjunctivae pink.  Sclerae nonicteric.  Oropharyngeal mucosa is  normal.  He is edentulous.  He forgot his dentures at the rest home.  NECK:  Without masses or thyromegaly.  CHEST:  No gynecomastia or __________  noted.  CARDIAC:  Regular rhythm.  Normal S1, S2.  No murmur or gallop noted.  LUNGS:  Clear to auscultation.  ABDOMEN:  Protuberant with normal bowel sounds.  On palpation it is soft and  nontender without organomegaly or masses.  RECTAL:  Deferred as one was just performed by Dr. Orvan Falconer revealing  burgundy stools.  NEUROLOGIC:  The patient is alert and oriented to place, person, and time  and responds appropriately to all questions.   LABS ON ADMISSION:  WBC 12.1, H&H 8.3 and 24.2, platelet count is 198 K.  INR is 1.0.  PTT is 23.  Sodium 135, potassium 4, chloride 105, CO2 is 24,  glucose 150, BUN 27, creatinine 1.6.  Bilirubin 0.4.  AP is 38, AST 17, ALT  17, total protein 5.9 with an albumin of 3.4, calcium is 8.9.   ASSESSMENT:  Alejandro [name] is a 70 year old African American male who presented  with a 3-4 day  history of passing dark blood per rectum who has developed  postural symptoms and noted to be anemic with a hemoglobin of 8.3 grams.  He  is on acetylsalicylic acid for antiplatelet function, however, there is no  history of peptic ulcer disease or abdominal pain, although he did have some  nausea and having yesterday.  I suspect he is bleeding from his lower  gastrointestinal tract but he could be easily bleeding from his upper or mid  tract given history of chronic use of acetylsalicylic acid.  The patient  appears to be hemodynamically stable since he received one unit of packed  red blood cells.  He could have a diverticular bleed, arteriovenous  malformation, colonic neoplasm, or peptic ulcer disease.   RECOMMENDATIONS:  1.  Agree with IV PPI therapy and transfusion.  2.  Will prep him with GoLYTELY early in the a.m. and he will undergo a      total colonoscopy tomorrow.  If his colon is clean, would be followed by      esophagogastroduodenoscopy.  I explained the procedure risks to the      patient and he is agreeable.  3.  He will have post transfusion H&H.   We would like to thank Dr. Orvan Falconer for the opportunity to participate in  the care of this gentleman.      NR/MEDQ  D:  07/10/2004  T:  07/10/2004  Job:  [id number]

## 2010-10-26 NOTE — Procedures (Signed)
NAME:  BYFORD, SCHOOLS NO.:  [id number]   MEDICAL RECORD NO.:  [id number]                   PATIENT TYPE:  REC   LOCATION:  RAD                                  FACILITY:  [facility]   PHYSICIAN:  Charlton Haws, M.D.                  DATE OF BIRTH:  Apr 14, 1941   DATE OF PROCEDURE:  DATE OF DISCHARGE:                                    STRESS TEST   PROCEDURE:  Exercise Cardiolite   INDICATIONS:  Mr. Kilgallon is a 70 year old male with no known coronary artery  disease with atypical chest discomfort.  The cardiac risk factors include:  Hypertension, dyslipidemia, tobacco abuse, and diabetes mellitus.   BASELINE DATA:  EKG reveals a sinus bradycardia at 57 beats/minute with  nonspecific ST abnormalities.  Blood pressure is 118/70.   The patient exercised for a total of 6 minutes and 1 second to Bruce  protocol stage 2 and 7.02 METS.  Maximum heart rate was 132 beats/minute  which is 85% of predicted maximum.  Maximum blood pressure is 168/72.   The patient denied any chest discomfort.  He did have some shortness of  breath at the end of exercise which resolved in recovery.   EKG revealed PACs and PVCs. He did have some ST depression in the inferior  leads which resolved very quickly in recovery.  Exercise was stopped  secondary to fatigue.   Final images and results are pending MD review.     ________________________________________  ___________________________________________  Jae Dire, P.A. [facility]                      Charlton Haws, M.D.   AB/MEDQ  D:  01/23/2004  T:  01/23/2004  Job:  [id number]

## 2010-10-26 NOTE — H&P (Signed)
Alejandro [name], Alejandro [name] NO.:  [id number]   MEDICAL RECORD NO.:  [id number]          PATIENT TYPE:  EMS   LOCATION:  ED                            FACILITY:  [facility]   PHYSICIAN:  Vania Rea, M.D. DATE OF BIRTH:  Jun 04, 1941   DATE OF ADMISSION:  07/10/2004  DATE OF DISCHARGE:  LH                                HISTORY & PHYSICAL   PRIMARY CARE PHYSICIAN:  Dr. Reynolds Bowl.   CARDIOLOGIST:  Dr. [place] Bing.   PSYCHIATRIST:  [facility].   CHIEF COMPLAINT:  Getting dizzy with standing and rectal bleeding for four  days.   HISTORY OF PRESENT ILLNESS:  This is a 70 year old African/American man, a  resident of an assisted living facility, who has a history of hypertension,  diabetes and schizophrenia, and who has been taking daily aspirin for the  past two years, who has not been having any nausea or vomiting, diarrhea or  constipation or abdominal pain, but who developed bright red blood per  rectum and dizziness with standing, for the past four days.  Eventually the  patient was taken to his primary health Rukaya Kleinschmidt today, who documented a  systolic blood pressure in the 70's, and the patient was sent to the  emergency room.   In the emergency room bright red blood per rectum was confirmed.  The  patient's blood pressure on presentation was recorded at 81/44, with a pulse  of 103.   PAST MEDICAL HISTORY:  1.  Diabetes type 2.  2.  Schizophrenia.  3.  Hypertension.  4.  History of PPD positivity, treated.  Denies a history or tuberculosis.  5.  Hyperlipidemia.   MEDICATIONS:  1.  Mevacor 80 mg each evening.  2.  Norvasc 10 mg daily.  3.  Aspirin 325 mg daily.  4.  Metformin 500 mg daily.  5.  Lisinopril/HCTZ 20/12.5 mg daily.  6.  Risperdal 3 mg, one tab q.a.m. and 2-1/2 tab at bedtime.  7.  The patient also takes Tylenol p.r.n. pain.  Denies other NSAID use.   ALLERGIES:  No known drug allergies.   SOCIAL HISTORY:  He has  been a resident of [facility] for  the past three years.  He is a retired Copy.  He has been schizophrenic  for the past 15 years.  He has been smoking one pack of cigarettes per day  since age 8.  He used to drink three drinks of vodka per day, but not since  admission to [facility].  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   FAMILY HISTORY:  His parents died of strokes and heart attacks.  He has four  siblings, but he is unaware of their health.  He has never been married, and  has no children.   REVIEW OF SYSTEMS:  Other than noted in the admission history and physical,  the patient denies any other symptoms on a 10-point review of systems.   PHYSICAL EXAMINATION:  GENERAL:  A very pleasant, weak-looking obese  African/American man,  lying in bed.  He denies any pain.  VITAL SIGNS:  Temperature 97.7 degrees orally, pulse 89, blood pressure  96/60, respirations 20.  HEENT:  Pupils round and equal.  His mucous membranes are pale and dry.  CHEST:  He has a few rhonchi in the left base.  CARDIOVASCULAR:  Regular rhythm.  ABDOMEN:  Obese, soft, nontender.  There are no masses.  EXTREMITIES:  Are without edema.  He has 2+ pulses bilaterally.   LABORATORY DATA:  His white count is 12.1, hemoglobin 8.3, hematocrit 24.2,  MCV 86, RDW 12.6, platelet count 198.  He has 88% neutrophils, absolute  granulocyte count 10.6.  His sodium is 135, potassium 4.1, chloride 105, CO2  of 24, glucose 150, BUN 25, creatinine 1.6, calcium 8.9.  Total protein 5.9,  albumin 3.4, AST and ALT normal at 17.  Alkaline phosphatase 38, total  bilirubin 0.4.   ASSESSMENT:  1.  Acute gastrointestinal bleed, probably lower gastrointestinal bleed      causing severe anemia and hypotension, related to gastrointestinal      bleed.  2.  Diabetes type 2.  Control is unknown at this time.  3.  History of hypertension.  4.  Chronic schizophrenia.   PLAN:  I agree with type  and crossmatching of 4 units of packed red blood  cells.  Will transfuse 2 units, and will get a GI evaluation for endoscopy.  will hold anti-hypertensives for now.  chek Hbaic while here  continue Risperdal for psychosis      LC/MEDQ  D:  07/10/2004  T:  07/10/2004  Job:  [id number]

## 2010-10-26 NOTE — Op Note (Signed)
NAMEADETOKUNBO, MCCADDEN                 ACCOUNT NO.:  [id number]   MEDICAL RECORD NO.:  [id number]          PATIENT TYPE:  INP   LOCATION:  A219                          FACILITY:  [facility]   PHYSICIAN:  Lionel December, M.D.    DATE OF BIRTH:  1941-01-08   DATE OF PROCEDURE:  07/11/2004  DATE OF DISCHARGE:                                 OPERATIVE REPORT   PROCEDURE:  Total colonoscopy followed by esophagogastroduodenoscopy.   INDICATION:  Alejandro [name] a 70 year old African-American male who presents with  acute GI bleed.  He had a hemoglobin of 8.2 g on admission.  He has received  two units, and his hemoglobin is up to 8.9 grams.  He really does not have  any symptoms.  He is on ASA for antiplatelet function.  Procedure risks were  reviewed the patient, informed consent was obtained.   PREMEDICATION:  Demerol 50 mg IV, Versed 8 mg IV in divided dose.  Next,  Cetacaine spray for pharyngeal topical anesthesia.   FINDINGS:  Procedure was performed in endoscopy suite.  The patient's vital  signs and O2 saturation were monitored during procedure and remained stable.   PROCEDURE #1:  Total colonoscopy.  The patient was placed in left lateral  position and rectal examination performed.  He had some dark coffee-grounds  material in the gloved finger.  Olympus video scope was placed in the rectum  and advanced under vision into sigmoid colon and beyond.  A very redundant  colon with pooling of coffee-ground material in different segments; however,  there was no active bleeding noted.  Multiple diverticula were noted at  sigmoid colon with few a more and descending and transverse colon.  He had  multiple diverticula at ascending colon; however, none of these were  bleeding.  Somewhat were quite large.  Cecum was identified by ileocecal  valve and appendiceal orifice.  Pictures taken for the record.  As the scope  was withdrawn, colonic mucosa was examined for the second time and there  were no polyps,  tumor masses or arteriovenous malformations.  Rectal mucosa  was normal.  The scope was retroflexed to examine the anorectal junction, and small  hemorrhoids were noted below the dentate line.  Given these findings, it was  decided to proceed with EGD.   Esophagogastroduodenoscopy.  Olympus video scope was passed via oropharynx  without any difficulty into esophagus.   Esophagus:  Mucosa of the esophagus was normal.  He had noncritical  incomplete ring at GE junction, which is at 40 cm from the incisors.  Hiatus  was at 44.  He had a 4 cm long sliding hiatal hernia.   Stomach:  It was empty and distended very well with insufflation.  Folds of  proximal stomach were normal.  Examination of mucosa at body, antrum,  pyloric channel as well as angularis, fundus and cardia were normal.   Duodenum:  There were four erosions at bulb, but none of these were  bleeding.  No ulcer crater was noted.  The scope was passed second part of  the duodenum, where mucosa and folds  were normal.  Endoscope was withdrawn.  The patient tolerated the procedures well.   FINAL DIAGNOSES:  1.  Pancolonic diverticulosis.  2.  The colon was full of coffee-ground material, but no active bleeding      noted.  3.  Small external hemorrhoids.  4.  Small sliding hiatal hernia.  5.  Bulbar duodenitis without stigmata of bleeding.   I suspect he must have bled from a right colonic diverticulum.   RECOMMENDATIONS:  1.  PPI will be changed p.o.  2.  H. pylori serology.  3.  Will advance his diet.  4.  Metamucil one tablespoonful daily.  5.  I would suggest holding his aspirin for about two weeks.  He is to have      a follow-up H&H later today in a.m..      NR/MEDQ  D:  07/11/2004  T:  07/11/2004  Job:  [id number]   cc:   Vania Rea, M.D.

## 2010-10-26 NOTE — Op Note (Signed)
[name], [name]                 ACCOUNT NO.:  [id number]   MEDICAL RECORD NO.:  [id number]          PATIENT TYPE:  AMB   LOCATION:  DAY                           FACILITY:  [facility]   PHYSICIAN:  Lionel December, M.D.    DATE OF BIRTH:  03-19-1941   DATE OF PROCEDURE:  01/10/2005  DATE OF DISCHARGE:  01/10/2005                                 OPERATIVE REPORT   PROCEDURE:  Small bowel given capsule study.   ENDOSCOPIST:  Lionel December, M.D.   INDICATION:  Mr. Alejandro [name] is 70 year old African American male with recurrent  anemia felt to be secondary to a GI bleed.  Previous workup includes EGD and  a colonoscopy on July 11, 2004.  At that time, he presented with  hemoglobin of 8.2 grams and was given 2 units of PRBCs.  He was noted to  have a small sliding hiatal hernia, bulbar duodenitis without stigmata of  bleeding, pan colonic diverticulosis.  The colon was full of coffee-ground  material, but no active bleeding site was noted, and he also had external  hemorrhoids.  It was felt that he may have bled from diverticulosis.  He  presented to the office last month, and his hemoglobin was down to 6.1.  He  was given 2 units of PRBCs.  He was therefore scheduled for small bowel  given study capsule to find out if he has any lesion in his small bowel to  account for his recurrent anemia and GI bleed.   Informed consent for the procedure was obtained from the patient.   FINDINGS:  The patient was able to swallow given capsule without any  difficulty.  It reached the stomach in 35 seconds.  It passed into the bulb  in 17 minutes.  Quality of pictures was satisfactory until 1 hour and 30  minutes. From there on he had food debris in his two-thirds of the small  bowel, and the mucosa could not be well seen. Ileocecal valve similarly  could not be identified.  Segments of colonic mucosa post 4 hours was seen  but the areas that were examined, no lesion was noted.   Similarly, the ileocecal  valve was not well seen.  The mucosa of colon was  obvious at 4 hours but capsule was documented to have passed to into colon  on delayed images post 5 hours.   A few petechiae were seen at the stomach as well as in the duodenum and  proximal jejunum, but there was no active bleeding.   FINAL DIAGNOSES:  1.  Few petechiae noted at involving gastric mucosa as well as duodenum and      jejunum.  2.  The surface of the small bowel mucosa could not be seen because of food      debris limiting the quality of this study.   RECOMMENDATIONS:  1.  The patient was advised to go back on his iron.  2.  He will have H&H repeated in 2 weeks.  3.  If he is documented to have another episode of acute bleeding, we will  make arrangements for GI bleeding scan as soon as possible.      Lionel December, M.D.  Electronically Signed     NR/MEDQ  D:  01/30/2005  T:  01/31/2005  Job:  [id number]   cc:   Dr. [facility]

## 2010-12-31 ENCOUNTER — Encounter: Payer: Self-pay | Admitting: Cardiology

## 2011-01-03 ENCOUNTER — Ambulatory Visit: Payer: Self-pay | Admitting: Cardiology

## 2011-01-10 ENCOUNTER — Encounter: Payer: Self-pay | Admitting: Cardiology

## 2011-01-16 ENCOUNTER — Other Ambulatory Visit (HOSPITAL_COMMUNITY): Payer: Self-pay | Admitting: Podiatry

## 2011-01-16 DIAGNOSIS — R52 Pain, unspecified: Secondary | ICD-10-CM

## 2011-01-16 DIAGNOSIS — R231 Pallor: Secondary | ICD-10-CM

## 2011-01-18 ENCOUNTER — Ambulatory Visit (HOSPITAL_COMMUNITY)
Admission: RE | Admit: 2011-01-18 | Discharge: 2011-01-18 | Disposition: A | Discharge status: Home / Self Care | Payer: Medicaid Other | Source: Ambulatory Visit | Attending: Podiatry | Admitting: Podiatry

## 2011-01-18 DIAGNOSIS — L97509 Non-pressure chronic ulcer of other part of unspecified foot with unspecified severity: Secondary | ICD-10-CM | POA: Insufficient documentation

## 2011-01-18 DIAGNOSIS — E119 Type 2 diabetes mellitus without complications: Secondary | ICD-10-CM | POA: Insufficient documentation

## 2011-01-18 DIAGNOSIS — R52 Pain, unspecified: Secondary | ICD-10-CM

## 2011-01-18 DIAGNOSIS — M79609 Pain in unspecified limb: Secondary | ICD-10-CM | POA: Insufficient documentation

## 2011-01-18 DIAGNOSIS — R231 Pallor: Secondary | ICD-10-CM

## 2011-01-23 ENCOUNTER — Encounter: Payer: Self-pay | Admitting: Cardiology

## 2011-01-23 ENCOUNTER — Ambulatory Visit (INDEPENDENT_AMBULATORY_CARE_PROVIDER_SITE_OTHER): Payer: Medicaid Other | Admitting: Cardiology

## 2011-01-23 DIAGNOSIS — R55 Syncope and collapse: Secondary | ICD-10-CM

## 2011-01-23 DIAGNOSIS — I10 Essential (primary) hypertension: Secondary | ICD-10-CM

## 2011-01-23 DIAGNOSIS — I4949 Other premature depolarization: Secondary | ICD-10-CM

## 2011-01-23 NOTE — Patient Instructions (Signed)
Your physician recommends that you continue on your current medications as directed. Please refer to the Current Medication list given to you today.  Your physician recommends that you schedule a follow-up appointment in: 1 YEAR  

## 2011-01-23 NOTE — Assessment & Plan Note (Signed)
Patient has history of PVCs. He is asymptomatic. He has routine blood work done at [facility]. Will not do any further workup at this time.

## 2011-01-23 NOTE — Assessment & Plan Note (Signed)
Blood pressure is well controlled 

## 2011-01-23 NOTE — Assessment & Plan Note (Signed)
Patient has no recurrent symptoms of dizziness or presyncope. He does have some skipping on physical exam today but is asymptomatic with this. He is on low-dose beta blocker. His last echocardiogram was in June 2011 which revealed borderline LVH, normal LV function ejection fraction 50-55%. He had mildly calcified annulus of the aortic valve. Follow-up echo in one year.

## 2011-01-23 NOTE — Progress Notes (Signed)
HPI: This is a 70 year old African American male patient who is here for 6 month followup. He has a history of dizziness and syncope as well as hypertension. He denies any problems with dizziness or presyncope at this time.He denies chest pain, palpitations, dyspnea, dyspnea on exertion, dizziness or presyncope. Patient had minor right great toe surgery yesterday and is doing fine.  Patient does have schizophrenia and lives in a group home. He is brought to the office today by a caregiver.  No Known Allergies  Current Outpatient Prescriptions on File Prior to Visit  Medication Sig Dispense Refill  . acetaminophen (TYLENOL) 325 MG tablet Take 650 mg by mouth every 6 (six) hours as needed.        Marland Kitchen albuterol (VENTOLIN HFA) 108 (90 BASE) MCG/ACT inhaler Inhale 2 puffs into the lungs. Every 4-6 hours prn       . amLODipine (NORVASC) 5 MG tablet Take 5 mg by mouth daily.        Marland Kitchen aspirin (ASPIR-LOW) 81 MG EC tablet Take 81 mg by mouth daily.        Marland Kitchen dutasteride (AVODART) 0.5 MG capsule Take 0.5 mg by mouth daily.        Marland Kitchen lisinopril-hydrochlorothiazide (PRINZIDE,ZESTORETIC) 20-12.5 MG per tablet Take 1 tablet by mouth daily.        Marland Kitchen lovastatin (MEVACOR) 40 MG tablet Take 80 mg by mouth daily.        . metFORMIN (GLUCOPHAGE) 500 MG tablet Take 500 mg by mouth 2 (two) times daily.        . metoprolol succinate (TOPROL XL) 25 MG 24 hr tablet Take 25 mg by mouth daily.        Marland Kitchen omeprazole (PRILOSEC) 20 MG capsule Take 20 mg by mouth daily.        . risperiDONE (RISPERDAL) 3 MG tablet Take 3 mg by mouth 2 (two) times daily.        . Tamsulosin HCl (FLOMAX) 0.4 MG CAPS Take by mouth daily.          Past Medical History  Diagnosis Date  . HLD (hyperlipidemia)   . HTN (hypertension)   . Schizophrenia   . Positive PPD     hx- treated for tuberculosis  . DM2 (diabetes mellitus, type 2)   . Diverticulosis     pancolonic   . Bleeding     hemorrhoidal and diverticular     No past surgical  history on file.  No family history on file.  History   Social History  . Marital Status: Single    Spouse Name: N/A    Number of Children: N/A  . Years of Education: N/A   Occupational History  . Janitor     Retired   Social History Main Topics  . Smoking status: Current Everyday Smoker  . Smokeless tobacco: Not on file  . Alcohol Use: No  . Drug Use: No     prior use of crack cocaine.   . Sexually Active: Not on file   Other Topics Concern  . Not on file   Social History Narrative   [facility]. RetiredTeacher, music.     ROS: See HPI Eyes: Negative Ears:Negative for hearing loss, tinnitus Cardiovascular: Negative for chest pain, palpitations,irregular heartbeat, dyspnea, dyspnea on exertion, near-syncope, orthopnea, paroxysmal nocturnal dyspnia and syncope,edema, claudication, cyanosis,.  Respiratory:   Negative for cough, hemoptysis, shortness of breath, sleep disturbances due to breathing, sputum production and wheezing.   Endocrine: Negative  for cold intolerance and heat intolerance.  Hematologic/Lymphatic: Negative for adenopathy and bleeding problem. Does not bruise/bleed easily.  Musculoskeletal: Very little pain in his right great toe that was operated on yesterday..   Gastrointestinal: Negative for nausea, vomiting, reflux, abdominal pain, diarrhea, constipation.   Neurological: Negative.  Allergic/Immunologic: Negative for environmental allergies.   PHYSICAL EXAM: Well-nournished, in no acute distress. Neck: No JVD, HJR, Bruit, or thyroid enlargement Lungs: No tachypnea, clear without wheezing, rales, or rhonchi Cardiovascular: RRR with occasional skipping, positive S4, 2/6 systolic murmur at the left sternal border, no bruit, thrill, or heave. Abdomen: BS normal. Soft without organomegaly, masses, lesions or tenderness. Extremities: without cyanosis, clubbing or edema. Good distal pulses bilateralRight great toe bandaged. SKin: Warm, no lesions  or rashes  Musculoskeletal: Right great toe bandaged,No deformities Neuro: no focal signs  BP 121/70  Pulse 59  Ht 5\' 7"  (1.702 m)  Wt 202 lb (91.627 kg)  BMI 31.64 kg/m2  SpO2 94%

## 2011-03-11 LAB — GLUCOSE, CAPILLARY: Glucose-Capillary: 105 — ABNORMAL HIGH

## 2012-01-29 ENCOUNTER — Ambulatory Visit (INDEPENDENT_AMBULATORY_CARE_PROVIDER_SITE_OTHER): Payer: Medicare Other | Admitting: Cardiology

## 2012-01-29 ENCOUNTER — Encounter: Payer: Self-pay | Admitting: Cardiology

## 2012-01-29 VITALS — BP 103/66 | HR 63 | Ht 68.0 in | Wt 207.1 lb

## 2012-01-29 DIAGNOSIS — I4949 Other premature depolarization: Secondary | ICD-10-CM

## 2012-01-29 DIAGNOSIS — Z87898 Personal history of other specified conditions: Secondary | ICD-10-CM

## 2012-01-29 NOTE — Assessment & Plan Note (Signed)
Previously documented PVC's, heart rate regular today. LVEF low normal by previous echocardiogram.

## 2012-01-29 NOTE — Progress Notes (Signed)
Clinical Summary Alejandro [name] is a 71 y.o.male presenting for followup. He was seen by Ms. Alejandro [name] in August 2012. He is here with an aide from his group home. He reports no palpitations or syncope. Medications are reviewed.  ECG today shows sinus bradycardia with LBBB, nonspecific T wave changes.  Previous echocardiogram reviewed.   No Known Allergies  Current Outpatient Prescriptions  Medication Sig Dispense Refill  . acetaminophen (TYLENOL) 325 MG tablet Take 650 mg by mouth every 6 (six) hours as needed.        Marland Kitchen albuterol (VENTOLIN HFA) 108 (90 BASE) MCG/ACT inhaler Inhale 2 puffs into the lungs. Every 4-6 hours prn       . amLODipine (NORVASC) 5 MG tablet Take 5 mg by mouth daily.        . Ascorbic Acid (VITAMIN C) 1000 MG tablet Take 1,000 mg by mouth daily.      Marland Kitchen aspirin (ASPIR-LOW) 81 MG EC tablet Take 81 mg by mouth daily.        . Cholecalciferol (VITAMIN D3) 1200 UNIT/15ML LIQD Take 2 drops by mouth daily.      Marland Kitchen dutasteride (AVODART) 0.5 MG capsule Take 0.5 mg by mouth daily.        Marland Kitchen econazole nitrate 1 % cream       . fish oil-omega-3 fatty acids 1000 MG capsule Take 1 g by mouth 2 (two) times daily.      . fluticasone (FLONASE) 50 MCG/ACT nasal spray       . lisinopril-hydrochlorothiazide (PRINZIDE,ZESTORETIC) 20-12.5 MG per tablet Take 2 tablets by mouth daily.       Marland Kitchen lovastatin (MEVACOR) 40 MG tablet Take 40 mg by mouth at bedtime.       . meloxicam (MOBIC) 15 MG tablet Take 15 mg by mouth daily.       . metFORMIN (GLUCOPHAGE) 500 MG tablet Take 500 mg by mouth 2 (two) times daily.        . metoprolol succinate (TOPROL XL) 25 MG 24 hr tablet Take 25 mg by mouth daily.        . Multiple Vitamins-Minerals (MULTIVITAMIN WITH MINERALS) tablet Take 1 tablet by mouth daily.      Marland Kitchen omeprazole (PRILOSEC) 20 MG capsule Take 20 mg by mouth daily.        . risperiDONE (RISPERDAL) 3 MG tablet Take 3 mg by mouth 2 (two) times daily.       . Tamsulosin HCl (FLOMAX) 0.4 MG CAPS Take  by mouth daily.        . traZODone (DESYREL) 150 MG tablet         Past Medical History  Diagnosis Date  . Mixed hyperlipidemia   . Essential hypertension, benign   . Schizophrenia   . Positive PPD     Treated  . DM2 (diabetes mellitus, type 2)   . Diverticulosis     Pancolonic   . Hemorrhoids   . PVC's (premature ventricular contractions)   . Syncope     Social History Mr. Alejandro [name] reports that he has been smoking.  He does not have any smokeless tobacco history on file. Mr. Alejandro [name] reports that he does not drink alcohol.  Review of Systems Stable appetite, no stated bleeding problems.  Physical Examination Filed Vitals:   01/29/12 1407  BP: 103/66  Pulse: 63   Obese male in no acute distress.  HEENT: Conjunctiva and lids normal, oropharynx with poor dentition.  Neck: Supple, elevated JVP or carotid bruits.  Lungs: Clear to auscultation, nonlabored.  Cardiac: Regular rate and rhythm, soft S4, no S3, no pericardial rub.  Abdomen: Protuberant, obese, unable to easily palpate liver edge, bowel sounds present, no tenderness.  Extremities: No pitting edema, distal pulses 1+.    Problem List and Plan   History of syncope No recurrence. Continue observation. Followup arranged.  OTHER PREMATURE BEATS Previously documented PVC's, heart rate regular today. LVEF low normal by previous echocardiogram.    Jonelle Sidle, M.D., F.A.C.C.

## 2012-01-29 NOTE — Assessment & Plan Note (Signed)
No recurrence. Continue observation. Followup arranged.

## 2012-01-29 NOTE — Patient Instructions (Addendum)
Your physician recommends that you schedule a follow-up appointment in: 1 year  

## 2012-04-01 ENCOUNTER — Ambulatory Visit (INDEPENDENT_AMBULATORY_CARE_PROVIDER_SITE_OTHER): Payer: Medicare Other | Admitting: Urgent Care

## 2012-04-01 ENCOUNTER — Encounter: Payer: Self-pay | Admitting: Internal Medicine

## 2012-04-01 VITALS — BP 112/79 | HR 68 | Temp 97.2°F | Ht 66.0 in | Wt 204.2 lb

## 2012-04-01 DIAGNOSIS — R1310 Dysphagia, unspecified: Secondary | ICD-10-CM

## 2012-04-01 NOTE — Progress Notes (Signed)
Faxed to PCP

## 2012-04-01 NOTE — Patient Instructions (Addendum)
Continue omeprazole 20mg  30 minutes before breakfast Dysphagia 2 diet until EGD Wear your dentures while eating Quit smoking!  You can do it! EGD (upper endoscopy) with Dr Darrick Penna.  She may stretch your esophagus at the same time. Dysphagia Diet Level 2, Mechanically Altered This dysphagia mechanically altered diet is restricted to:  Foods that are moist, soft-textured, and easy to chew and swallow.  Meats that are ground or minced to no larger than -inch pieces. Meats are moist with gravy or sauce added.  Foods that do not include bread or bread-like textures except soft pancakes, well-moistened with syrup or sauce.  Textures with some chewing ability required.  Casseroles without rice.  Cooked vegetables that are less than -inch in size and easily mashed with a fork. No cooked corn, peas, broccoli, cauliflower, cabbage, Brussels sprouts, asparagus, or other fibrous, non-tender, or rubbery cooked vegetables.  Canned fruit except for pineapple. Fruit must be cut into no larger than -inch pieces.  Foods that do not include nuts, seeds, coconut, or sticky textures. FOOD TEXTURES Includes all foods listed on Dysphagia Diet Level 1, Pureed, in addition to the foods listed below. Beverages  Recommended: All beverages thickened to recommended consistency with minimal amounts of texture pulp. Any texture should be suspended in the liquid and should not fall out.  Avoid: All others.  You are currently limited to one of the following liquid consistency levels:  Thin.  Nectar-like.  Honey-like.  Spoon-thick. Breads  Recommended: Soft pancakes, well-moistened with syrup or sauce.  Avoid: All others. Cereals  Recommended: Cooked cereals with little texture, including oatmeal. Unprocessed wheat bran stirred into cereals for bulk. If thin liquids are restricted, it is important that all of the liquid is absorbed into the cereal.  Avoid: All dry cereals and any cooked cereals that  may contain flax seeds or other seeds or nuts. Whole-grain, dry, or coarse cereals. Cereals with nuts, seeds, dried fruit, or coconut. Desserts  Recommended: Pudding, custard. Soft fruit pies with bottom crust only. Canned fruit (excluding pineapple). Soft, moist cakes with icing.  Avoid: Dry, coarse cakes and cookies. Anything with nuts, seeds, coconut, pineapple, or dried fruit. Breakfast yogurt with nuts. Rice or bread pudding.  These foods are considered thin liquids and should be avoided if thin liquids are restricted:  Frozen malts, milk shakes, frozen yogurt, eggnog, nutritional supplements, ice cream, sherbet, regular or sugar-free gelatin, or any foods that become thin liquid at either room temperature, 70 F (21.1 C) or body temperature, 98 F (36.7 C). Fats  Recommended: Butter, margarine, cream for cereal (depending on liquid consistency recommendations), gravy, cream sauces, sour cream, sour cream dips with soft additives, mayonnaise, salad dressings, cream cheese, cream cheese spreads with soft additives, whipped toppings.  Avoid: All fats with coarse or chunky additives. Fruits  Recommended: Soft drained, canned, or cooked fruits without seeds or skin. Fresh soft and ripe banana. Fruit juices with a small amount of pulp. If thin liquids are restricted, fruit juices should be thickened to appropriate consistency.  Avoid: Fresh or frozen fruits. Cooked fruit with skin or seeds. Dried fruits. Fresh, canned, or cooked pineapple. Meats and Meat Substitutes Meat pieces should not exceed -inch cubes and should be tender.  Recommended: Moistened ground or cooked meat, poultry, or fish. Moist ground or tender meat may be served with gravy or sauce. Casseroles without rice. Moist macaroni and cheese, well-cooked pasta with meat sauce, tuna noodle casserole, soft, moist lasagna. Moist meatballs, meatloaf, or fish loaf. Protein salads,  such as tuna or egg without large chunks, celery, or  onion. Cottage cheese, smooth quiche without large chunks. Poached, scrambled, or soft-cooked eggs (egg yolks should not be "runny" but should be moist and able to be mashed with butter, margarine, or other moisture added to them). Cook eggs to 160 F (71.1 C) or use pasteurized eggs for safety. Souffls may have small, soft chunks. Tofu. Well-cooked, slightly mashed, moist legumes such as baked beans. All meats or protein substitutes should be served with sauces or moistened to help maintain cohesiveness in the mouth.  Avoid: Dry meats and tough meats, such as bacon, sausage, hot dogs, and bratwurst. Dry casseroles or casseroles with rice or large chunks. Peanut butter. Cheese slices and cubes. Hard-cooked or crisp fried eggs. Sandwiches. Pizza. Potatoes and Starches  Recommended: Well-cooked, moistened, boiled, baked, or mashed potatoes. Well-cooked shredded hash brown potatoes that are not crisp. All potatoes need to be moist and in sauces. Well-cooked noodles in sauce. Spaetzel or soft dumplings that have been moistened with butter or gravy.  Avoid: Potato skins and chips. Fried or French-fried potatoes. Rice. Soups  Recommended: Soups with easy-to-chew or easy-to-swallow meats or vegetables. Contents in soups should be less than -inch pieces. Soups will need to be thickened to appropriate consistency if soup is thinner than prescribed liquid consistency.  Avoid: Soups with large chunks of meat and vegetables. Soups with rice, corn, peas. Vegetables  Recommended: All soft, well-cooked vegetables. Vegetables should be less than -inch pieces. They should be easily mashed with a fork.  Avoid: Cooked corn and peas. Broccoli, cabbage, Brussels sprouts, asparagus, or other fibrous, non-tender, or rubbery cooked vegetables. Miscellaneous  Recommended: Jams and preserves without seeds, jelly. Sauces or salsas with small, tender, less than -inch pieces. Soft, smooth chocolate bars that are easily  chewed.  Avoid: Seeds, nuts, coconut, or sticky foods. Chewy candies such as caramels or licorice. Document Released: 05/27/2005 Document Revised: 08/19/2011 Document Reviewed: 06/19/2009 [facility] Patient Information 2013 [place], [place].

## 2012-04-01 NOTE — Assessment & Plan Note (Addendum)
Alejandro [name] is a pleasant 71 y.o. male with new-onset dysphagia.  Distant hx incomplete ring GEJ back in 2006.  Will need EGD with possible esophageal dilation with Dr. Darrick Penna to look for esophageal web, ring or stricture.  I have discussed risks & benefits which include, but are not limited to, bleeding, infection, perforation & drug reaction.  The patient agrees with this plan & written consent will be obtained.    Dysphagia 2 diet Omeprazole 20mg  daily 30 min before breakfast  Wear dentures while eating Tobacco cessation counseled

## 2012-04-01 NOTE — Progress Notes (Signed)
Referring Provider: Margorie John, MD Primary Care Physician:  Margorie John, MD Primary Gastroenterologist:  Dr. Jonette Eva  Chief Complaint  Patient presents with  . Dysphagia    HPI:  Alejandro [name] is a 71 y.o. male here as a referral from Dr. Luster Landsberg for dysphagia.  Pt is a resident of [facility].  He is mentally challenge, but has no POA.  3 days ago, pt was eating corn beef hash & he felt like it got stuck in mid-esohagus & crawled back up his esophagus.  +odynophagia.  He tells me that even water comes back up.  He just started having heartburn & indigestion over 3 days.  Omeprazole 20mg  daily started 3 days ago.  No NSAIDS.  Pt has dentures but does not like to use them. Hx incomplete ring GEJ back in 2006.     Past Medical History  Diagnosis Date  . Mixed hyperlipidemia   . Essential hypertension, benign   . Schizophrenia   . Positive PPD     Treated  . DM2 (diabetes mellitus, type 2)   . Diverticulosis     Pancolonic   . Hemorrhoids   . PVC's (premature ventricular contractions)   . Syncope   . Mentally challenged   . History of GI diverticular bleed     Past Surgical History  Procedure Date  . Colonoscopy  07/11/2004     Rehman-Pancolonic diverticulosis/Small external hemorrhoids/ The colon was full of coffee-ground material, but no active bleeding  . Esophagogastroduodenoscopy 07/11/2004    Rehman-incomplete ring GEJ, Small sliding hiatal hernia/Bulbar duodenitis without stigmata of bleeding  . Givens capsule study 01/10/2005    Rehman-Few petechiae noted at involving gastric mucosa as well as duodenum and  jejunum/ the  surface of the small bowel mucosa could not be seen because of food   debris limiting the quality of this study.  . Colonoscopy 03/29/2008    Large mouth ascending colon and sigmoid colon diverticula which were frequent.  Otherwise, no polyps, masses, inflammatory changes or AVM seen. /Small internal hemorrhoids, otherwise  normal retroflexed view of the rectum  . Arm surgery     left    Current Outpatient Prescriptions  Medication Sig Dispense Refill  . acetaminophen (TYLENOL) 325 MG tablet Take 650 mg by mouth every 6 (six) hours as needed.        Marland Kitchen albuterol (VENTOLIN HFA) 108 (90 BASE) MCG/ACT inhaler Inhale 2 puffs into the lungs. Every 4-6 hours prn       . amLODipine (NORVASC) 5 MG tablet Take 5 mg by mouth daily.        . Ascorbic Acid (VITAMIN C) 1000 MG tablet Take 1,000 mg by mouth daily.      Marland Kitchen aspirin (ASPIR-LOW) 81 MG EC tablet Take 81 mg by mouth daily.        . Cholecalciferol (VITAMIN D3) 1200 UNIT/15ML LIQD Take 2 drops by mouth daily.      Marland Kitchen dutasteride (AVODART) 0.5 MG capsule Take 0.5 mg by mouth daily.        Marland Kitchen econazole nitrate 1 % cream Apply 1 application topically 2 (two) times daily.       . fish oil-omega-3 fatty acids 1000 MG capsule Take 1 g by mouth 2 (two) times daily.      . fluticasone (FLONASE) 50 MCG/ACT nasal spray Place 1 spray into the nose daily.       Marland Kitchen lisinopril-hydrochlorothiazide (PRINZIDE,ZESTORETIC) 20-12.5 MG per tablet Take  2 tablets by mouth daily.       Marland Kitchen lovastatin (MEVACOR) 40 MG tablet Take 40 mg by mouth at bedtime.       . meloxicam (MOBIC) 15 MG tablet Take 15 mg by mouth daily.       . metFORMIN (GLUCOPHAGE) 500 MG tablet Take 500 mg by mouth daily with breakfast.       . metoprolol succinate (TOPROL XL) 25 MG 24 hr tablet Take 25 mg by mouth daily.        . Multiple Vitamins-Minerals (MULTIVITAMIN WITH MINERALS) tablet Take 1 tablet by mouth daily.      Marland Kitchen omeprazole (PRILOSEC) 20 MG capsule Take 20 mg by mouth daily.        . risperiDONE (RISPERDAL) 3 MG tablet Take 3 mg by mouth 2 (two) times daily.       . Tamsulosin HCl (FLOMAX) 0.4 MG CAPS Take by mouth daily.        . traZODone (DESYREL) 150 MG tablet         Allergies as of 04/01/2012  . (No Known Allergies)   Family History:There is no known family history of colorectal carcinoma , liver  disease, or inflammatory bowel disease.  Problem Relation Age of Onset  . Stroke Father   . Stroke Mother   . CAD Mother    History   Social History  . Marital Status: Single    Spouse Name: N/A    Number of Children: N/A  . Years of Education: N/A   Occupational History  . Janitor     Retired   Social History Main Topics  . Smoking status: Current Every Day Smoker -- 0.5 packs/day    Types: Cigarettes  . Smokeless tobacco: Not on file  . Alcohol Use: No  . 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 Sexually Active: Not on file   Other Topics Concern  . 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    Review of Systems: Gen: Denies any fever, chills, sweats, anorexia, fatigue, weakness, malaise, weight loss, and sleep disorder CV: Denies chest pain, angina, palpitations, syncope, orthopnea, PND, peripheral edema, and claudication. Resp: Denies dyspnea at rest, dyspnea with exercise, cough, sputum, wheezing, coughing up blood, and pleurisy. GI: Denies vomiting blood, jaundice, and fecal incontinence.   GU : Denies urinary burning, blood in urine, urinary frequency, urinary hesitancy, nocturnal urination, and urinary incontinence. MS: Denies joint pain, limitation of movement, and swelling, stiffness, low back pain, extremity pain. Denies muscle weakness, cramps, atrophy.  Derm: Denies rash, itching, dry skin, hives, moles, warts, or unhealing ulcers.  Psych: Denies depression, anxiety, memory loss, suicidal ideation, hallucinations, paranoia, and confusion. Heme: Denies bruising, bleeding, and enlarged lymph nodes. Neuro:  Denies any headaches, dizziness, paresthesias. Endo:  Denies any problems with DM, thyroid, adrenal function.  Physical Exam: BP 112/79  Pulse 68  Temp 97.2 F (36.2 C) (Temporal)  Ht 5\' 6"  (1.676 m)  Wt 204 lb 3.2 oz (92.625 kg)  BMI 32.96 kg/m2 No LMP for male patient. General:    Alert,  Mentally-challenged, obese, pleasant and cooperative in NAD.  Accompanied by caregiver. Head:  Normocephalic and atraumatic. Eyes:  Sclera clear, no icterus.   Conjunctiva pink. Ears:  Normal auditory acuity. Nose:  No deformity, discharge, or lesions. Mouth:  +edentelous.  No deformity or lesions,oropharynx pink & moist. Neck:  Supple;  no masses or thyromegaly. Lungs:  Clear throughout to auscultation.   No wheezes, crackles, or rhonchi. No acute distress. Heart:  Regular rate and rhythm; no murmurs, clicks, rubs,  or gallops. Abdomen:  Normal bowel sounds.  No bruits.  Soft, non-tender and non-distended without masses, hepatosplenomegaly or hernias noted.  No guarding or rebound tenderness.   Rectal:  Deferred  Msk:  Symmetrical without gross deformities.  Pulses:  Normal pulses noted. Extremities:  No edema. Neurologic:  Alert and oriented x4;  grossly normal neurologically. Skin:  Intact without significant lesions or rashes. Lymph Nodes:  No significant cervical adenopathy. Psych:  Alert and cooperative. Normal mood and affect.

## 2012-04-08 ENCOUNTER — Encounter (HOSPITAL_COMMUNITY): Payer: Self-pay | Admitting: Pharmacy Technician

## 2012-04-17 ENCOUNTER — Ambulatory Visit (HOSPITAL_COMMUNITY)
Admission: RE | Admit: 2012-04-17 | Discharge: 2012-04-17 | Disposition: A | Discharge status: Home / Self Care | Payer: Medicare Other | Source: Ambulatory Visit | Attending: Gastroenterology | Admitting: Gastroenterology

## 2012-04-17 ENCOUNTER — Encounter (HOSPITAL_COMMUNITY): Payer: Self-pay | Admitting: *Deleted

## 2012-04-17 ENCOUNTER — Encounter (HOSPITAL_COMMUNITY)
Admission: RE | Disposition: A | Discharge status: Home / Self Care | Payer: Self-pay | Source: Ambulatory Visit | Attending: Gastroenterology

## 2012-04-17 DIAGNOSIS — K2960 Other gastritis without bleeding: Secondary | ICD-10-CM

## 2012-04-17 DIAGNOSIS — E119 Type 2 diabetes mellitus without complications: Secondary | ICD-10-CM | POA: Insufficient documentation

## 2012-04-17 DIAGNOSIS — K225 Diverticulum of esophagus, acquired: Secondary | ICD-10-CM | POA: Insufficient documentation

## 2012-04-17 DIAGNOSIS — R1310 Dysphagia, unspecified: Secondary | ICD-10-CM

## 2012-04-17 DIAGNOSIS — I10 Essential (primary) hypertension: Secondary | ICD-10-CM | POA: Insufficient documentation

## 2012-04-17 DIAGNOSIS — K2940 Chronic atrophic gastritis without bleeding: Secondary | ICD-10-CM | POA: Insufficient documentation

## 2012-04-17 HISTORY — PX: ESOPHAGOGASTRODUODENOSCOPY (EGD) WITH ESOPHAGEAL DILATION: SHX5812

## 2012-04-17 SURGERY — ESOPHAGOGASTRODUODENOSCOPY (EGD) WITH ESOPHAGEAL DILATION
Anesthesia: Moderate Sedation

## 2012-04-17 MED ORDER — BUTAMBEN-TETRACAINE-BENZOCAINE 2-2-14 % EX AERO
INHALATION_SPRAY | CUTANEOUS | Status: DC | PRN
  Administered 2012-04-17: 2 via TOPICAL

## 2012-04-17 MED ORDER — MIDAZOLAM HCL 5 MG/5ML IJ SOLN
INTRAMUSCULAR | Status: AC
  Filled 2012-04-17: qty 10

## 2012-04-17 MED ORDER — STERILE WATER FOR IRRIGATION IR SOLN
Status: DC | PRN
  Administered 2012-04-17: 11:00:00

## 2012-04-17 MED ORDER — SODIUM CHLORIDE 0.45 % IV SOLN
INTRAVENOUS | Status: DC
  Administered 2012-04-17: 10:00:00 via INTRAVENOUS

## 2012-04-17 MED ORDER — OMEPRAZOLE 20 MG PO CPDR: DELAYED_RELEASE_CAPSULE | ORAL | Status: DC

## 2012-04-17 MED ORDER — MINERAL OIL PO OIL
TOPICAL_OIL | ORAL | Status: AC
  Filled 2012-04-17: qty 30

## 2012-04-17 MED ORDER — MEPERIDINE HCL 100 MG/ML IJ SOLN
INTRAMUSCULAR | Status: AC
  Filled 2012-04-17: qty 1

## 2012-04-17 MED ORDER — MIDAZOLAM HCL 5 MG/5ML IJ SOLN
INTRAMUSCULAR | Status: DC | PRN
  Administered 2012-04-17 (×2): 2 mg via INTRAVENOUS
  Administered 2012-04-17: 1 mg via INTRAVENOUS

## 2012-04-17 MED ORDER — MEPERIDINE HCL 100 MG/ML IJ SOLN
INTRAMUSCULAR | Status: DC | PRN
  Administered 2012-04-17 (×3): 25 mg via INTRAVENOUS

## 2012-04-17 NOTE — Op Note (Signed)
[facility] [address] [place], [address]   ENDOSCOPY PROCEDURE REPORT  PATIENT: Alejandro [name], Alejandro [name]  MR#: [id number] BIRTHDATE: 12/22/1940 , 71  yrs. old GENDER: Male  ENDOSCOPIST: Jonette Eva, MD REFFERED [id number] Dahlia Bailiff, M.D.  PROCEDURE DATE:  04/17/2012 PROCEDURE:   EGD with biopsy and EGD with dilatation over guidewire   INDICATIONS:1.  dysphagia. MEDICATIONS: Demerol 75 mg IV and Versed 5 mg IV TOPICAL ANESTHETIC: Cetacaine Spray  DESCRIPTION OF PROCEDURE:   After the risks benefits and alternatives of the procedure were thoroughly explained, informed consent was obtained.  The EG-[id number] ([id number])  endoscope was introduced through the mouth and advanced to the second portion of the duodenum.  The instrument was slowly withdrawn as the mucosa was carefully examined.  Prior to withdrawal of the scope, the guidwire was placed.  The esophagus was dilated successfully.  The patient was recovered in endoscopy and discharged home in satisfactory condition.      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   The esophagus was otherwise normal.  STOMACH: Moderate erosive gastritis (inflammation) was found. Multiple biopsies were performed using cold forceps.  DUODENUM: Mild duodenal inflammation was found in the duodenal bulb. The duodenal mucosa showed no abnormalities in the 2nd part of the duodenum.   Dilation was then performed at the  Dilator: Savary over guidewire Size(s): 14-17 MM Resistance: minimal Heme: none  COMPLICATIONS: There were no complications.   ENDOSCOPIC IMPRESSION: 1.   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  NO DEFINITE STRICTURE APPRECIATED. 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 2.   The esophagus was otherwise normal. 3.   MODERATE Erosive gastritis (inflammation) was found;  multiple biopsies 4.   MILD Duodenal inflammation was found in the duodenal bulb 5.   The duodenal mucosa showed no abnormalities in the 2nd part of the duodenum  RECOMMENDATIONS: TAKE OMERPAZOLE DAILY FOREVER. AWAIT BIOPSIES. LOW FAT DIET BA SWALLOW TO ASSESS FOR ZENKER'S OPV IN 4 MOS      _______________________________ eSignedJonette Eva, MD 04/17/2012 12:16 PM      PATIENT NAME:  Alejandro [name], Alejandro [name] MR#: [id number]

## 2012-04-17 NOTE — H&P (Signed)
Primary Care Physician:  Margorie John, MD Primary Gastroenterologist:  Dr. Darrick Penna  Pre-Procedure History & Physical: HPI:  Alejandro [name] is a 71 y.o. male here for DYSPHAGIA.    Past Medical History  Diagnosis Date  . Mixed hyperlipidemia   . Essential hypertension, benign   . Schizophrenia   . Positive PPD     Treated  . DM2 (diabetes mellitus, type 2)   . Diverticulosis     Pancolonic   . Hemorrhoids   . PVC's (premature ventricular contractions)   . Syncope   . Mentally challenged   . History of GI diverticular bleed     Past Surgical History  Procedure Date  . Colonoscopy  07/11/2004     Rehman-Pancolonic diverticulosis/Small external hemorrhoids/ The colon was full of coffee-ground material, but no active bleeding  . Esophagogastroduodenoscopy 07/11/2004    Rehman-incomplete ring GEJ, Small sliding hiatal hernia/Bulbar duodenitis without stigmata of bleeding  . Givens capsule study 01/10/2005    Rehman-Few petechiae noted at involving gastric mucosa as well as duodenum and  jejunum/ the  surface of the small bowel mucosa could not be seen because of food   debris limiting the quality of this study.  . Colonoscopy 03/29/2008    Large mouth ascending colon and sigmoid colon diverticula which were frequent.  Otherwise, no polyps, masses, inflammatory changes or AVM seen. /Small internal hemorrhoids, otherwise normal retroflexed view of the rectum  . Arm surgery     left    Prior to Admission medications   Medication Sig Start Date End Date Taking? Authorizing Provider  amLODipine (NORVASC) 5 MG tablet Take 5 mg by mouth daily.     Yes Historical Provider, MD  Ascorbic Acid (VITAMIN C) 1000 MG tablet Take 1,000 mg by mouth daily.   Yes Historical Provider, MD  aspirin (ASPIR-LOW) 81 MG EC tablet Take 81 mg by mouth daily.     Yes Historical Provider, MD  Cholecalciferol (VITAMIN D3) 1200 UNIT/15ML LIQD Take 2 drops by mouth daily.   Yes Historical Provider, MD    dutasteride (AVODART) 0.5 MG capsule Take 0.5 mg by mouth daily.     Yes Historical Provider, MD  econazole nitrate 1 % cream Apply 1 application topically 2 (two) times daily.  12/23/11  Yes Historical Provider, MD  fish oil-omega-3 fatty acids 1000 MG capsule Take 1 g by mouth 2 (two) times daily.   Yes Historical Provider, MD  lisinopril-hydrochlorothiazide (PRINZIDE,ZESTORETIC) 20-12.5 MG per tablet Take 2 tablets by mouth daily.    Yes Historical Provider, MD  lovastatin (MEVACOR) 40 MG tablet Take 40 mg by mouth at bedtime.    Yes Historical Provider, MD  meloxicam (MOBIC) 15 MG tablet Take 15 mg by mouth daily.  12/02/11  Yes Historical Provider, MD  metFORMIN (GLUCOPHAGE) 500 MG tablet Take 500 mg by mouth daily with breakfast.    Yes Historical Provider, MD  metoprolol succinate (TOPROL XL) 25 MG 24 hr tablet Take 25 mg by mouth daily.     Yes Historical Provider, MD  Multiple Vitamins-Minerals (MULTIVITAMIN WITH MINERALS) tablet Take 1 tablet by mouth daily.   Yes Historical Provider, MD  omeprazole (PRILOSEC) 20 MG capsule Take 20 mg by mouth daily as needed. Reflux.   Yes Historical Provider, MD  risperiDONE (RISPERDAL) 3 MG tablet Take 3 mg by mouth 2 (two) times daily.    Yes Historical Provider, MD  Tamsulosin HCl (FLOMAX) 0.4 MG CAPS Take by mouth daily.  Yes Historical Provider, MD  traZODone (DESYREL) 150 MG tablet  01/28/12  Yes Historical Provider, MD  acetaminophen (TYLENOL) 325 MG tablet Take 650 mg by mouth every 6 (six) hours as needed. Pain.    Historical Provider, MD  albuterol (VENTOLIN HFA) 108 (90 BASE) MCG/ACT inhaler Inhale 2 puffs into the lungs every 4 (four) hours as needed. Shortness of breath.    Historical Provider, MD  fluticasone (FLONASE) 50 MCG/ACT nasal spray Place 1 spray into the nose daily.  01/23/12   Historical Provider, MD    Allergies as of 04/01/2012  . (No Known Allergies)    Family History  Problem Relation Age of Onset  . Stroke Father    . Stroke Mother   . CAD Mother     History   Social History  . Marital Status: Single    Spouse Name: N/A    Number of Children: N/A  . Years of Education: N/A   Occupational History  . Janitor     Retired   Social History Main Topics  . Smoking status: Current Every Day Smoker -- 0.5 packs/day for 20 years    Types: Cigarettes  . Smokeless tobacco: Not on file  . Alcohol Use: No  . 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 Sexually Active: Not on file   Other Topics Concern  . 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     Review of Systems: See HPI, otherwise negative ROS   Physical Exam: BP 132/91  Pulse 65  Temp 97.7 F (36.5 C) (Oral)  Resp 26  Ht 5\' 6"  (1.676 m)  Wt 204 lb (92.534 kg)  BMI 32.93 kg/m2  SpO2 94% General:   Alert,  pleasant and cooperative in NAD Head:  Normocephalic and atraumatic. Neck:  Supple; Lungs:  Clear throughout to auscultation.    Heart:  Regular rate and rhythm. Abdomen:  Soft, nontender and nondistended. Normal bowel sounds, without guarding, and without rebound.   Neurologic:  Alert and  oriented x4;  grossly normal neurologically.  Impression/Plan:     DYSPHAGIA  PLAN:  EGD/DIL TODAY

## 2012-04-20 ENCOUNTER — Other Ambulatory Visit: Payer: Self-pay | Admitting: Gastroenterology

## 2012-04-20 ENCOUNTER — Telehealth: Payer: Self-pay | Admitting: Gastroenterology

## 2012-04-20 DIAGNOSIS — K225 Diverticulum of esophagus, acquired: Secondary | ICD-10-CM

## 2012-04-20 DIAGNOSIS — R1310 Dysphagia, unspecified: Secondary | ICD-10-CM

## 2012-04-20 NOTE — Telephone Encounter (Signed)
Message copied by Glendora Score on Mon Apr 20, 2012 10:20 AM ------      Message from: [place]      Created: Fri Apr 17, 2012 12:17 PM       Needs barium swallow next week, dx: dysphagia, evaluate for zenker's diverticulum

## 2012-04-20 NOTE — Telephone Encounter (Signed)
REVIEWED.  

## 2012-04-20 NOTE — Telephone Encounter (Signed)
Patient is scheduled on Thursday Nov 14th at 11:00 and patients caregiver is aware

## 2012-04-21 NOTE — Telephone Encounter (Signed)
Please call pt. His stomach Bx shows gastritis.    CONTINUE OMEPRAZOLE. TAKE IT EVERY MORNING.  COMPLETE THE BARIUM SWALLOW TO COMPLETE THE EVALUATION OF YOUR SWALLOWING.  FOLLOW UP IN 4 MOS.

## 2012-04-21 NOTE — Telephone Encounter (Signed)
LM for caregiver, Princess Perna, to call for pt's results.

## 2012-04-21 NOTE — Telephone Encounter (Signed)
Path faxed to PCP, recall made 

## 2012-04-21 NOTE — Telephone Encounter (Signed)
T/c back from [facility] and she was informed of pt's results.

## 2012-04-22 ENCOUNTER — Encounter (HOSPITAL_COMMUNITY): Payer: Self-pay | Admitting: Gastroenterology

## 2012-04-22 NOTE — Progress Notes (Addendum)
Results EGD/DIL NOV 2013 ATROPHIC GASTRITIS   REVIEWED.  BPE PENDING.

## 2012-04-23 ENCOUNTER — Ambulatory Visit (HOSPITAL_COMMUNITY)
Admission: RE | Admit: 2012-04-23 | Discharge: 2012-04-23 | Disposition: A | Discharge status: Home / Self Care | Payer: Medicare Other | Source: Ambulatory Visit | Attending: Gastroenterology | Admitting: Gastroenterology

## 2012-04-23 DIAGNOSIS — K225 Diverticulum of esophagus, acquired: Secondary | ICD-10-CM

## 2012-04-23 DIAGNOSIS — R1310 Dysphagia, unspecified: Secondary | ICD-10-CM | POA: Insufficient documentation

## 2012-04-24 NOTE — Telephone Encounter (Signed)
Results faxed to PCP 

## 2012-04-24 NOTE — Telephone Encounter (Signed)
PLEASE CALL PT.  His barium study shows a weak esophagus, which may contribute to difficulty swallowing. It can happen as your get older. He should follow a soft mechanical diet. He can pick up a HO.  SOFT MECHANICAL DIET This SOFT MECHANICAL DIET is restricted to:  Foods that are moist, soft-textured, and easy to chew and swallow.   Meats that are ground or are minced no larger than one-quarter inch pieces. Meats are moist with gravy or sauce added.   Foods that do not include bread or bread-like textures except soft pancakes, well-moistened with syrup or sauce.   Textures with some chewing ability required.   Casseroles without rice.   Cooked vegetables that are less than half an inch in size and easily mashed with a fork. No cooked corn, peas, broccoli, cauliflower, cabbage, Brussels sprouts, asparagus, or other fibrous, non-tender or rubbery cooked vegetables.   Canned fruit except for pineapple. Fruit must be cut into pieces no larger than half an inch in size.   Foods that do not include nuts, seeds, coconut, or sticky textures.   FOOD TEXTURES FOR DYSPHAGIA DIET LEVEL 2 -SOFT MECHANICAL DIET (includes all foods on Dysphagia Diet Level 1 - Pureed, in addition to the foods listed below)  FOOD GROUP: Breads. RECOMMENDED: Soft pancakes, well-moistened with syrup or sauce.  AVOID: All others.  FOOD GROUP: Cereals.  RECOMMENDED: Cooked cereals with little texture, including oatmeal. Unprocessed wheat bran stirred into cereals for bulk. Note: If thin liquids are restricted, it is important that all of the liquid is absorbed into the cereal.  AVOID: All dry cereals and any cooked cereals that may contain flax seeds or other seeds or nuts. Whole-grain, dry, or coarse cereals. Cereals with nuts, seeds, dried fruit, and/or coconut.  FOOD GROUP: Desserts. RECOMMENDED: Pudding, custard. Soft fruit pies with bottom crust only. Canned fruit (excluding pineapple). Soft, moist cakes with  icing.Frozen malts, milk shakes, frozen yogurt, eggnog, nutritional supplements, ice cream, sherbet, regular or sugar-free gelatin, or any foods that become thin liquid at either room (70 F) or body temperature (98 F).  AVOID: Dry, coarse cakes and cookies. Anything with nuts, seeds, coconut, pineapple, or dried fruit. Breakfast yogurt with nuts. Rice or bread pudding.  FOOD GROUP: Fats. RECOMMENDED: Butter, margarine, cream for cereal (depending on liquid consistency recommendations), gravy, cream sauces, sour cream, sour cream dips with soft additives, mayonnaise, salad dressings, cream cheese, cream cheese spreads with soft additives, whipped toppings.  AVOID: All fats with coarse or chunky additives.  FOOD GROUP: Fruits. RECOMMENDED: Soft drained, canned, or cooked fruits without seeds or skin. Fresh soft and ripe banana. Fruit juices with a small amount of pulp. If thin liquids are restricted, fruit juices should be thickened to appropriate consistency.  AVOID: Fresh or frozen fruits. Cooked fruit with skin or seeds. Dried fruits. Fresh, canned, or cooked pineapple.  FOOD GROUP: Meats and Meat Substitutes. (Meat pieces should not exceed 1/4 of an inch cube and should be tender.) RECOMMENDED: Moistened ground or cooked meat, poultry, or fish. Moist ground or tender meat may be served with gravy or sauce. Casseroles without rice. Moist macaroni and cheese, well-cooked pasta with meat sauce, tuna noodle casserole, soft, moist lasagna. Moist meatballs, meatloaf, or fish loaf. Protein salads, such as tuna or egg without large chunks, celery, or onion. Cottage cheese, smooth quiche without large chunks. Poached, scrambled, or soft-cooked eggs (egg yolks should not be "runny" but should be moist and able to be mashed with butter, margarine,  or other moisture added to them). (Cook eggs to 160 F or use pasteurized eggs for safety.) Souffls may have small, soft chunks. Tofu. Well-cooked, slightly  mashed, moist legumes, such as baked beans. All meats or protein substitutes should be served with sauces or moistened to help maintain cohesiveness in the oral cavity.  AVOID: Dry meats, tough meats (such as bacon, sausage, hot dogs, bratwurst). Dry casseroles or casseroles with rice or large chunks. Peanut butter. Cheese slices and cubes. Hard-cooked or crisp fried eggs. Sandwiches.Pizza.  FOOD GROUP: Potatoes and Starches. RECOMMENDED: Well-cooked, moistened, boiled, baked, or mashed potatoes. Well-cooked shredded hash brown potatoes that are not crisp. (All potatoes need to be moist and in sauces.)Well-cooked noodles in sauce. Spaetzel or soft dumplings that have been moistened with butter or gravy.  AVOID: Potato skins and chips. Fried or French-fried potatoes. Rice.  FOOD GROUP: Soups. RECOMMENDED: Soups with easy-to-chew or easy-to-swallow meats or vegetables: Particle sizes in soups should be less than 1/2 inch. Soups will need to be thickened to appropriate consistency if soup is thinner than prescribed liquid consistency.  AVOID: Soups with large chunks of meat and vegetables. Soups with rice, corn, peas.  FOOD GROUP: Vegetables. RECOMMENDED: All soft, well-cooked vegetables. Vegetables should be less than a half inch. Should be easily mashed with a fork.  AVOID: Cooked corn and peas. Broccoli, cabbage, Brussels sprouts, asparagus, or other fibrous, non-tender or rubbery cooked vegetables.  FOOD GROUP: Miscellaneous. RECOMMENDED: Jams and preserves without seeds, jelly. Sauces, salsas, etc., that may have small tender chunks less than 1/2 inch. Soft, smooth chocolate bars that are easily chewed.  AVOID: Seeds, nuts, coconut, or sticky foods. Chewy candies such as caramels or licorice.

## 2012-04-24 NOTE — Telephone Encounter (Signed)
Called the facility and left message for caregiver, Alejandro [name] to call. Mailed a copy of the info to the address.

## 2012-04-27 NOTE — Telephone Encounter (Signed)
Alejandro [name] returned call and was informed of results. Reviewed diet with her and she is aware a copy is in the mail.

## 2012-04-27 NOTE — Telephone Encounter (Signed)
LMOM to call.

## 2012-06-01 ENCOUNTER — Observation Stay (HOSPITAL_COMMUNITY)
Admission: EM | Admit: 2012-06-01 | Discharge: 2012-06-02 | Payer: Medicare Other | Attending: Internal Medicine | Admitting: Internal Medicine

## 2012-06-01 ENCOUNTER — Encounter (HOSPITAL_COMMUNITY): Payer: Self-pay | Admitting: *Deleted

## 2012-06-01 ENCOUNTER — Emergency Department (HOSPITAL_COMMUNITY): Payer: Medicare Other

## 2012-06-01 DIAGNOSIS — N179 Acute kidney failure, unspecified: Secondary | ICD-10-CM | POA: Insufficient documentation

## 2012-06-01 DIAGNOSIS — R1310 Dysphagia, unspecified: Secondary | ICD-10-CM

## 2012-06-01 DIAGNOSIS — I10 Essential (primary) hypertension: Secondary | ICD-10-CM

## 2012-06-01 DIAGNOSIS — R0602 Shortness of breath: Secondary | ICD-10-CM

## 2012-06-01 DIAGNOSIS — N289 Disorder of kidney and ureter, unspecified: Secondary | ICD-10-CM

## 2012-06-01 DIAGNOSIS — E079 Disorder of thyroid, unspecified: Secondary | ICD-10-CM | POA: Diagnosis present

## 2012-06-01 DIAGNOSIS — R0902 Hypoxemia: Secondary | ICD-10-CM | POA: Diagnosis present

## 2012-06-01 DIAGNOSIS — F209 Schizophrenia, unspecified: Secondary | ICD-10-CM

## 2012-06-01 DIAGNOSIS — E785 Hyperlipidemia, unspecified: Secondary | ICD-10-CM

## 2012-06-01 DIAGNOSIS — K760 Fatty (change of) liver, not elsewhere classified: Secondary | ICD-10-CM

## 2012-06-01 DIAGNOSIS — E876 Hypokalemia: Secondary | ICD-10-CM | POA: Diagnosis present

## 2012-06-01 DIAGNOSIS — J09X2 Influenza due to identified novel influenza A virus with other respiratory manifestations: Principal | ICD-10-CM | POA: Insufficient documentation

## 2012-06-01 DIAGNOSIS — K8020 Calculus of gallbladder without cholecystitis without obstruction: Secondary | ICD-10-CM | POA: Diagnosis present

## 2012-06-01 DIAGNOSIS — J111 Influenza due to unidentified influenza virus with other respiratory manifestations: Secondary | ICD-10-CM

## 2012-06-01 DIAGNOSIS — J40 Bronchitis, not specified as acute or chronic: Secondary | ICD-10-CM

## 2012-06-01 DIAGNOSIS — E119 Type 2 diabetes mellitus without complications: Secondary | ICD-10-CM

## 2012-06-01 DIAGNOSIS — E860 Dehydration: Secondary | ICD-10-CM | POA: Insufficient documentation

## 2012-06-01 DIAGNOSIS — K5790 Diverticulosis of intestine, part unspecified, without perforation or abscess without bleeding: Secondary | ICD-10-CM

## 2012-06-01 LAB — CBC WITH DIFFERENTIAL/PLATELET
Basophils Absolute: 0.1 K/uL (ref 0.0–0.1)
Basophils Relative: 1 % (ref 0–1)
Eosinophils Absolute: 0 K/uL (ref 0.0–0.7)
Eosinophils Relative: 0 % (ref 0–5)
HCT: 39.5 % (ref 39.0–52.0)
Hemoglobin: 13.2 g/dL (ref 13.0–17.0)
Lymphocytes Relative: 15 % (ref 12–46)
Lymphs Abs: 1.2 K/uL (ref 0.7–4.0)
MCH: 27 pg (ref 26.0–34.0)
MCHC: 33.4 g/dL (ref 30.0–36.0)
MCV: 80.9 fL (ref 78.0–100.0)
Monocytes Absolute: 0.6 K/uL (ref 0.1–1.0)
Monocytes Relative: 8 % (ref 3–12)
Neutro Abs: 5.8 K/uL (ref 1.7–7.7)
Neutrophils Relative %: 76 % (ref 43–77)
Platelets: 180 K/uL (ref 150–400)
RBC: 4.88 MIL/uL (ref 4.22–5.81)
RDW: 15.6 % — ABNORMAL HIGH (ref 11.5–15.5)
WBC: 7.7 K/uL (ref 4.0–10.5)

## 2012-06-01 LAB — BLOOD GAS, ARTERIAL
Drawn by: 21310
O2 Content: 2 L/min
Patient temperature: 37
TCO2: 23.5 mmol/L (ref 0–100)
pCO2 arterial: 40.4 mmHg (ref 35.0–45.0)
pH, Arterial: 7.432 (ref 7.350–7.450)

## 2012-06-01 LAB — BASIC METABOLIC PANEL WITH GFR
BUN: 23 mg/dL (ref 6–23)
CO2: 29 meq/L (ref 19–32)
Calcium: 9.6 mg/dL (ref 8.4–10.5)
Chloride: 96 meq/L (ref 96–112)
Creatinine, Ser: 1.36 mg/dL — ABNORMAL HIGH (ref 0.50–1.35)
GFR calc Af Amer: 58 mL/min — ABNORMAL LOW
GFR calc non Af Amer: 50 mL/min — ABNORMAL LOW
Glucose, Bld: 106 mg/dL — ABNORMAL HIGH (ref 70–99)
Potassium: 3.4 meq/L — ABNORMAL LOW (ref 3.5–5.1)
Sodium: 136 meq/L (ref 135–145)

## 2012-06-01 LAB — D-DIMER, QUANTITATIVE: D-Dimer, Quant: 1.19 ug/mL-FEU — ABNORMAL HIGH (ref 0.00–0.48)

## 2012-06-01 LAB — PRO B NATRIURETIC PEPTIDE: Pro B Natriuretic peptide (BNP): 113.5 pg/mL (ref 0–125)

## 2012-06-01 MED ORDER — DUTASTERIDE 0.5 MG PO CAPS
0.5000 mg | ORAL_CAPSULE | Freq: Every day | ORAL | Status: DC
  Administered 2012-06-02: 0.5 mg via ORAL
  Filled 2012-06-01 (×2): qty 1

## 2012-06-01 MED ORDER — POLYETHYLENE GLYCOL 3350 17 G PO PACK: 17.0000 g | PACK | Freq: Every day | ORAL | Status: DC | PRN

## 2012-06-01 MED ORDER — FLUTICASONE PROPIONATE 50 MCG/ACT NA SUSP
1.0000 | Freq: Every day | NASAL | Status: DC
  Administered 2012-06-02: 1 via NASAL
  Filled 2012-06-01: qty 16

## 2012-06-01 MED ORDER — SODIUM CHLORIDE 0.9 % IV SOLN
INTRAVENOUS | Status: DC
  Administered 2012-06-01 – 2012-06-02 (×2): via INTRAVENOUS

## 2012-06-01 MED ORDER — VITAMIN D3 1200 UNIT/15ML PO LIQD: 2.0000 [drp] | Freq: Every day | ORAL | Status: DC

## 2012-06-01 MED ORDER — ASPIRIN EC 81 MG PO TBEC
81.0000 mg | DELAYED_RELEASE_TABLET | Freq: Every day | ORAL | Status: DC
  Administered 2012-06-02: 81 mg via ORAL
  Filled 2012-06-01: qty 1

## 2012-06-01 MED ORDER — ONDANSETRON HCL 4 MG/2ML IJ SOLN: 4.0000 mg | Freq: Four times a day (QID) | INTRAMUSCULAR | Status: DC | PRN

## 2012-06-01 MED ORDER — VITAMIN C 500 MG PO TABS
1000.0000 mg | ORAL_TABLET | Freq: Every day | ORAL | Status: DC
  Administered 2012-06-02: 1000 mg via ORAL
  Filled 2012-06-01: qty 2

## 2012-06-01 MED ORDER — DEXTROSE 5 % IV SOLN
1.0000 g | Freq: Every day | INTRAVENOUS | Status: DC
  Administered 2012-06-02: 1 g via INTRAVENOUS
  Filled 2012-06-01 (×3): qty 10

## 2012-06-01 MED ORDER — TAMSULOSIN HCL 0.4 MG PO CAPS
0.4000 mg | ORAL_CAPSULE | Freq: Every day | ORAL | Status: DC
  Administered 2012-06-01: 0.4 mg via ORAL
  Filled 2012-06-01: qty 1

## 2012-06-01 MED ORDER — ASPIRIN EC 81 MG PO TBEC: 81.0000 mg | DELAYED_RELEASE_TABLET | Freq: Every day | ORAL | Status: DC

## 2012-06-01 MED ORDER — PANTOPRAZOLE SODIUM 40 MG PO TBEC
40.0000 mg | DELAYED_RELEASE_TABLET | Freq: Every day | ORAL | Status: DC
  Administered 2012-06-02: 40 mg via ORAL
  Filled 2012-06-01: qty 1

## 2012-06-01 MED ORDER — HYDROCOD POLST-CHLORPHEN POLST 10-8 MG/5ML PO LQCR: 5.0000 mL | Freq: Two times a day (BID) | ORAL | Status: DC | PRN

## 2012-06-01 MED ORDER — METHYLPREDNISOLONE SODIUM SUCC 125 MG IJ SOLR
125.0000 mg | Freq: Once | INTRAMUSCULAR | Status: AC
  Administered 2012-06-01: 125 mg via INTRAVENOUS
  Filled 2012-06-01: qty 2

## 2012-06-01 MED ORDER — RISPERIDONE 1 MG PO TABS
3.0000 mg | ORAL_TABLET | Freq: Two times a day (BID) | ORAL | Status: DC
  Administered 2012-06-01 – 2012-06-02 (×2): 3 mg via ORAL
  Filled 2012-06-01 (×2): qty 3

## 2012-06-01 MED ORDER — ALUM & MAG HYDROXIDE-SIMETH 200-200-20 MG/5ML PO SUSP: 30.0000 mL | Freq: Four times a day (QID) | ORAL | Status: DC | PRN

## 2012-06-01 MED ORDER — OMEGA-3 FATTY ACIDS 1000 MG PO CAPS
1.0000 g | ORAL_CAPSULE | Freq: Two times a day (BID) | ORAL | Status: DC
Start: 2012-06-01 — End: 2012-06-01

## 2012-06-01 MED ORDER — SIMVASTATIN 20 MG PO TABS: 20.0000 mg | ORAL_TABLET | Freq: Every day | ORAL | Status: DC

## 2012-06-01 MED ORDER — ALBUTEROL SULFATE (5 MG/ML) 0.5% IN NEBU: 2.5000 mg | INHALATION_SOLUTION | RESPIRATORY_TRACT | Status: DC | PRN

## 2012-06-01 MED ORDER — IPRATROPIUM BROMIDE 0.02 % IN SOLN
0.5000 mg | Freq: Four times a day (QID) | RESPIRATORY_TRACT | Status: DC
  Administered 2012-06-02 (×3): 0.5 mg via RESPIRATORY_TRACT
  Filled 2012-06-01 (×3): qty 2.5

## 2012-06-01 MED ORDER — DEXTROSE 5 % IV SOLN
500.0000 mg | INTRAVENOUS | Status: DC
  Administered 2012-06-02: 500 mg via INTRAVENOUS
  Filled 2012-06-01 (×3): qty 500

## 2012-06-01 MED ORDER — ASPIRIN 81 MG PO TBEC: 81.0000 mg | DELAYED_RELEASE_TABLET | Freq: Every day | ORAL | Status: DC

## 2012-06-01 MED ORDER — TRAZODONE HCL 50 MG PO TABS
150.0000 mg | ORAL_TABLET | Freq: Every day | ORAL | Status: DC
  Administered 2012-06-01: 150 mg via ORAL
  Filled 2012-06-01: qty 3

## 2012-06-01 MED ORDER — ACETAMINOPHEN 325 MG PO TABS: 650.0000 mg | ORAL_TABLET | Freq: Four times a day (QID) | ORAL | Status: DC | PRN

## 2012-06-01 MED ORDER — AMLODIPINE BESYLATE 5 MG PO TABS
5.0000 mg | ORAL_TABLET | Freq: Every day | ORAL | Status: DC
  Administered 2012-06-02: 5 mg via ORAL
  Filled 2012-06-01: qty 1

## 2012-06-01 MED ORDER — IOHEXOL 350 MG/ML SOLN
100.0000 mL | Freq: Once | INTRAVENOUS | Status: AC | PRN
  Administered 2012-06-01: 100 mL via INTRAVENOUS

## 2012-06-01 MED ORDER — ONDANSETRON HCL 4 MG PO TABS: 4.0000 mg | ORAL_TABLET | Freq: Four times a day (QID) | ORAL | Status: DC | PRN

## 2012-06-01 MED ORDER — CHOLECALCIFEROL 10 MCG (400 UNIT) PO TABS
400.0000 [IU] | ORAL_TABLET | Freq: Every day | ORAL | Status: DC
Start: 2012-06-02 — End: 2012-06-02
  Administered 2012-06-02: 400 [IU] via ORAL
  Filled 2012-06-01: qty 1

## 2012-06-01 MED ORDER — ALBUTEROL SULFATE (5 MG/ML) 0.5% IN NEBU
5.0000 mg | INHALATION_SOLUTION | Freq: Once | RESPIRATORY_TRACT | Status: AC
  Administered 2012-06-01: 5 mg via RESPIRATORY_TRACT
  Filled 2012-06-01: qty 1

## 2012-06-01 MED ORDER — IPRATROPIUM BROMIDE 0.02 % IN SOLN
0.5000 mg | Freq: Once | RESPIRATORY_TRACT | Status: AC
  Administered 2012-06-01: 0.5 mg via RESPIRATORY_TRACT
  Filled 2012-06-01: qty 2.5

## 2012-06-01 MED ORDER — CLOTRIMAZOLE 1 % EX CREA
1.0000 "application " | TOPICAL_CREAM | Freq: Two times a day (BID) | CUTANEOUS | Status: DC
  Administered 2012-06-02: 1 via TOPICAL
  Filled 2012-06-01: qty 15

## 2012-06-01 MED ORDER — HEPARIN BOLUS VIA INFUSION
4000.0000 [IU] | Freq: Once | INTRAVENOUS | Status: AC
  Administered 2012-06-01: 4000 [IU] via INTRAVENOUS

## 2012-06-01 MED ORDER — ALBUTEROL SULFATE (5 MG/ML) 0.5% IN NEBU
2.5000 mg | INHALATION_SOLUTION | Freq: Four times a day (QID) | RESPIRATORY_TRACT | Status: DC
  Administered 2012-06-02 (×3): 2.5 mg via RESPIRATORY_TRACT
  Filled 2012-06-01 (×3): qty 0.5

## 2012-06-01 MED ORDER — METOPROLOL SUCCINATE ER 25 MG PO TB24
25.0000 mg | ORAL_TABLET | Freq: Every day | ORAL | Status: DC
  Administered 2012-06-02: 25 mg via ORAL
  Filled 2012-06-01: qty 1

## 2012-06-01 MED ORDER — OSELTAMIVIR PHOSPHATE 75 MG PO CAPS
75.0000 mg | ORAL_CAPSULE | Freq: Two times a day (BID) | ORAL | Status: DC
  Administered 2012-06-01 – 2012-06-02 (×2): 75 mg via ORAL
  Filled 2012-06-01 (×2): qty 1

## 2012-06-01 MED ORDER — ECONAZOLE NITRATE 1 % EX CREA: 1.0000 "application " | TOPICAL_CREAM | Freq: Two times a day (BID) | CUTANEOUS | Status: DC

## 2012-06-01 MED ORDER — OMEGA-3-ACID ETHYL ESTERS 1 G PO CAPS
1.0000 g | ORAL_CAPSULE | Freq: Two times a day (BID) | ORAL | Status: DC
  Administered 2012-06-01 – 2012-06-02 (×2): 1 g via ORAL
  Filled 2012-06-01 (×2): qty 1

## 2012-06-01 MED ORDER — HEPARIN (PORCINE) IN NACL 100-0.45 UNIT/ML-% IJ SOLN
1350.0000 [IU]/h | INTRAMUSCULAR | Status: DC
  Administered 2012-06-01: 1350 [IU]/h via INTRAVENOUS
  Filled 2012-06-01: qty 250

## 2012-06-01 MED ORDER — ENOXAPARIN SODIUM 40 MG/0.4ML ~~LOC~~ SOLN
40.0000 mg | SUBCUTANEOUS | Status: DC
  Administered 2012-06-02: 40 mg via SUBCUTANEOUS
  Filled 2012-06-01: qty 0.4

## 2012-06-01 MED ORDER — ONDANSETRON HCL 4 MG/2ML IJ SOLN
4.0000 mg | Freq: Once | INTRAMUSCULAR | Status: AC
  Administered 2012-06-01: 4 mg via INTRAVENOUS

## 2012-06-01 MED ORDER — ONDANSETRON HCL 4 MG/2ML IJ SOLN
INTRAMUSCULAR | Status: AC
  Filled 2012-06-01: qty 2

## 2012-06-01 NOTE — ED Notes (Signed)
MD at bedside. 

## 2012-06-01 NOTE — ED Notes (Signed)
Orthostatic vital signs were completed on pt. When pt was placed in a standing position pt became SOB and O2 level dropped to 78%. Pt had rapid respirations at 32 BPM. Pt became better once back in bed. RN an dMD made aware. Pt also made aware that a urine sample is needed. Pt unable to give sample at this time. Pt was left with a urinal.

## 2012-06-01 NOTE — ED Provider Notes (Signed)
History   This chart was scribed for Flint Melter, MD by Charolett Bumpers, ED Scribe. The patient was seen in room APA07/APA07. Patient's care was started at 1458.   CSN: [id number]  Arrival date & time 06/01/12  1418   First MD Initiated Contact with Patient 06/01/12 1458      Chief Complaint  Patient presents with  . Shortness of Breath   The history is provided by the patient and a caregiver. No language interpreter was used.   Alejandro [name] is a 71 y.o. male who presents to the Emergency Department complaining of persistent SOB with associated cough, fever, generalized weakness and wheezing since yesterday. He states that his symptoms have gradually worsened today. He is not on O2 at home and doesn't use inhalers or nebulizer. He has a h/o DM and HTN.  He is a smoker. He lives at [facility] in [facility] and is accompanied by a caregiver. She denies any sick contacts. She states the pt has had some difficulty ambulating due to generalized weakness.  Past Medical History  Diagnosis Date  . Mixed hyperlipidemia   . Essential hypertension, benign   . Schizophrenia   . Positive PPD     Treated  . DM2 (diabetes mellitus, type 2)   . Diverticulosis     Pancolonic   . Hemorrhoids   . PVC's (premature ventricular contractions)   . Syncope   . Mentally challenged   . History of GI diverticular bleed     Past Surgical History  Procedure Date  . Colonoscopy  07/11/2004     Rehman-Pancolonic diverticulosis/Small external hemorrhoids/ The colon was full of coffee-ground material, but no active bleeding  . Esophagogastroduodenoscopy 07/11/2004    Rehman-incomplete ring GEJ, Small sliding hiatal hernia/Bulbar duodenitis without stigmata of bleeding  . Givens capsule study 01/10/2005    Rehman-Few petechiae noted at involving gastric mucosa as well as duodenum and  jejunum/ the  surface of the small bowel mucosa could not be seen because of food   debris limiting the  quality of this study.  . Colonoscopy 03/29/2008    Large mouth ascending colon and sigmoid colon diverticula which were frequent.  Otherwise, no polyps, masses, inflammatory changes or AVM seen. /Small internal hemorrhoids, otherwise normal retroflexed view of the rectum  . Arm surgery     left  . Esophagogastroduodenoscopy (egd) with esophageal dilation 04/17/2012    Procedure: ESOPHAGOGASTRODUODENOSCOPY (EGD) WITH ESOPHAGEAL DILATION;  Surgeon: West Bali, MD;  Location: [facility];  Service: Endoscopy;  Laterality: N/A;  11:30    Family History  Problem Relation Age of Onset  . Stroke Father   . Stroke Mother   . CAD Mother     History  Substance Use Topics  . Smoking status: Current Every Day Smoker -- 0.5 packs/day for 20 years    Types: Cigarettes  . Smokeless tobacco: Not on file  . Alcohol Use: No      Review of Systems  Constitutional: Positive for fever.  Respiratory: Positive for shortness of breath and wheezing.   Neurological: Positive for weakness.  All other systems reviewed and are negative.    Allergies  Review of patient's allergies indicates no known allergies.  Home Medications   Current Outpatient Rx  Name  Route  Sig  Dispense  Refill  . ALBUTEROL SULFATE HFA 108 (90 BASE) MCG/ACT IN AERS   Inhalation   Inhale 2 puffs into the lungs every 4 (four) hours  as needed. FOR SHORTNESS OF BREATH         . AMLODIPINE BESYLATE 5 MG PO TABS   Oral   Take 5 mg by mouth daily.           Marland Kitchen VITAMIN C 1000 MG PO TABS   Oral   Take 1,000 mg by mouth daily.         . ASPIRIN 81 MG PO TBEC   Oral   Take 81 mg by mouth daily.           Marland Kitchen VITAMIN D3 1200 UNIT/15ML PO LIQD   Oral   Take 2 drops by mouth daily.         . DUTASTERIDE 0.5 MG PO CAPS   Oral   Take 0.5 mg by mouth daily.           . ECONAZOLE NITRATE 1 % EX CREA   Topical   Apply 1 application topically 2 (two) times daily.          . OMEGA-3 FATTY ACIDS 1000 MG PO  CAPS   Oral   Take 1 g by mouth 2 (two) times daily.         Marland Kitchen FLUTICASONE PROPIONATE 50 MCG/ACT NA SUSP   Nasal   Place 1 spray into the nose daily.          Marland Kitchen LISINOPRIL-HYDROCHLOROTHIAZIDE 20-12.5 MG PO TABS   Oral   Take 2 tablets by mouth daily.          Marland Kitchen LOVASTATIN 40 MG PO TABS   Oral   Take 40 mg by mouth at bedtime.          Marland Kitchen METFORMIN HCL 500 MG PO TABS   Oral   Take 500 mg by mouth daily. IF BLOOD SUGAR IS 150 FOR 3 WEEKS, INCREASE TO ONE TABLET TWICE DAILY         . METOPROLOL SUCCINATE ER 25 MG PO TB24   Oral   Take 25 mg by mouth daily.           . MULTI-VITAMIN/MINERALS PO TABS   Oral   Take 1 tablet by mouth daily.         Marland Kitchen OMEPRAZOLE 20 MG PO CPDR   Oral   Take 20 mg by mouth every morning. 1 po every morning         . RISPERIDONE 3 MG PO TABS   Oral   Take 3 mg by mouth 2 (two) times daily.          Marland Kitchen TAMSULOSIN HCL 0.4 MG PO CAPS   Oral   Take by mouth daily.           . TRAZODONE HCL 150 MG PO TABS               . ACETAMINOPHEN 325 MG PO TABS   Oral   Take 650 mg by mouth every 6 (six) hours as needed. Pain.         Marland Kitchen HYDROCOD POLST-CPM POLST ER 10-8 MG/5ML PO LQCR   Oral   Take 5 mLs by mouth every 12 (twelve) hours as needed.   140 mL   0   . LEVOFLOXACIN 500 MG PO TABS   Oral   Take 1 tablet (500 mg total) by mouth daily.   7 tablet   0   . OSELTAMIVIR PHOSPHATE 75 MG PO CAPS   Oral   Take 1 capsule (75 mg total) by  mouth 2 (two) times daily.   8 capsule   0     BP 106/67  Pulse 76  Temp 97.4 F (36.3 C) (Oral)  Resp 20  Ht 5' 6.5" (1.689 m)  Wt 210 lb 1.6 oz (95.301 kg)  BMI 33.40 kg/m2  SpO2 97%  Physical Exam  Nursing note and vitals reviewed. Constitutional: He is oriented to person, place, and time. He appears well-developed and well-nourished. No distress.  HENT:  Head: Normocephalic and atraumatic.  Eyes: Conjunctivae normal and EOM are normal.  Neck: Neck supple. No tracheal  deviation present.  Cardiovascular: Normal rate, regular rhythm and normal heart sounds.   No murmur heard. Pulmonary/Chest: Effort normal. No respiratory distress. He has wheezes. He has no rhonchi. He has no rales.       Scattered wheezes. Good air movement.   Abdominal: Soft. He exhibits no distension. There is no tenderness.  Musculoskeletal: Normal range of motion. He exhibits edema.       1+ edema in lower extremities.   Neurological: He is alert and oriented to person, place, and time. No sensory deficit.  Skin: Skin is dry.  Psychiatric: He has a normal mood and affect. His behavior is normal.    ED Course  Procedures (including critical care time)  DIAGNOSTIC STUDIES: Oxygen Saturation is 95% on room air, adequate by my interpretation.     16:35- during testing for Orthostatic vital sign abnormality; the patient became hypoxic and tachypneic, and was symptomatic. He was laid back down in the hypoxia, diminished.   Empiric treatment, started for pulmonary embolus; pending further evaluation with VQ scan.   I discussed the case with radiology, who recommended low-dose IV contrast, CT angiogram to evaluate for pulmonary embolus. This was done, and was negative for PE. He did show a probable thyroid mass. The heparin was discontinued  Reevaluation: 1930- patient. Wheezing, and somewhat uncomfortable Repeat nebulizer ordered. Dose with Solu-Medrol for bronchitis    Date: 03/27/2012  Rate: 85  Rhythm: normal sinus rhythm  QRS Axis: normal  PR and QT Intervals: normal  ST/T Wave abnormalities: normal  PR and QRS Conduction Disutrbances:none  Narrative Interpretation: PVC  Old EKG Reviewed: unchanged   COORDINATION OF CARE:  15:10-Discussed planned course of treatment with the caregiver including a breathing treatment, a chest x-ray, blood work and UA, who is agreeable at this time.   CRITICAL CARE Performed by: Flint Melter   Total critical care time: 45  minutes  Critical care time was exclusive of separately billable procedures and treating other patients.  Critical care was necessary to treat or prevent imminent or life-threatening deterioration.  Critical care was time spent personally by me on the following activities: development of treatment plan with patient and/or surrogate as well as nursing, discussions with consultants, evaluation of patient's response to treatment, examination of patient, obtaining history from patient or surrogate, ordering and performing treatments and interventions, ordering and review of laboratory studies, ordering and review of radiographic studies, pulse oximetry and re-evaluation of patient's condition.  Labs Reviewed  CBC WITH DIFFERENTIAL - Abnormal; Notable for the following:    RDW 15.6 (*)     All other components within normal limits  BASIC METABOLIC PANEL - Abnormal; Notable for the following:    Potassium 3.4 (*)     Glucose, Bld 106 (*)     Creatinine, Ser 1.36 (*)     GFR calc non Af Amer 50 (*)     GFR calc Af Denyse Dago  58 (*)     All other components within normal limits  D-DIMER, QUANTITATIVE - Abnormal; Notable for the following:    D-Dimer, Quant 1.19 (*)     All other components within normal limits  BLOOD GAS, ARTERIAL - Abnormal; Notable for the following:    pO2, Arterial 76.6 (*)     Bicarbonate 26.5 (*)     Acid-Base Excess 2.6 (*)     All other components within normal limits  INFLUENZA PANEL BY PCR - Abnormal; Notable for the following:    Influenza A By PCR POSITIVE (*)     H1N1 flu by pcr DETECTED (*)     All other components within normal limits  BASIC METABOLIC PANEL - Abnormal; Notable for the following:    Sodium 134 (*)     Glucose, Bld 241 (*)     GFR calc non Af Amer 69 (*)     GFR calc Af Amer 80 (*)     All other components within normal limits  CBC - Abnormal; Notable for the following:    HCT 38.7 (*)     RDW 15.6 (*)     All other components within normal  limits  URINALYSIS, ROUTINE W REFLEX MICROSCOPIC  TROPONIN I  LACTIC ACID, PLASMA  PRO B NATRIURETIC PEPTIDE  TSH  T4, FREE  CALCITONIN   No results found.   1. Shortness of breath   2. Bronchitis   3. Thyroid mass   4. Hypoxia   5. Essential hypertension, benign   6. Schizophrenia   7. Influenza   8. HYPERTENSION, BENIGN ESSENTIAL   9. Dysphagia   10. Diabetes mellitus   11. Hyperlipidemia   12. Diverticulosis   13. Acute renal insufficiency   14. Hypokalemia   15. Cholelithiasis   16. Hepatic steatosis       MDM  Weakness and shortness of breath with episode of hypoxia on standing. Most likely diagnosis is bronchitis. His thyroid mass does not appear to be large enough to push on airway causing symptomatic pulmonary distress. Patient will need admission for stabilization and further evaluation prior to discharge    I personally performed the services described in this documentation, which was scribed in my presence. The recorded information has been reviewed and is accurate.      Flint Melter, MD 06/05/12 1050

## 2012-06-01 NOTE — ED Notes (Signed)
Pt here from [facility] in [place]   Pt says he is sob and weak for 1-2 days.

## 2012-06-01 NOTE — Progress Notes (Signed)
ANTICOAGULATION CONSULT NOTE - Initial Consult  Pharmacy Consult for Heparin Indication: pulmonary embolus  No Known Allergies  Patient Measurements: Height: 5' 6.5" (168.9 cm) Weight: 206 lb (93.441 kg) IBW/kg (Calculated) : 64.95  Heparin Dosing Weight: 85 kg  Vital Signs: Temp: 99.7 F (37.6 C) (12/23 1511) Temp src: Oral (12/23 1511) BP: 123/81 mmHg (12/23 1618) Pulse Rate: 102  (12/23 1618)  Labs:  Basename 06/01/12 1513  HGB 13.2  HCT 39.5  PLT 180  APTT --  LABPROT --  INR --  HEPARINUNFRC --  CREATININE 1.36*  CKTOTAL --  CKMB --  TROPONINI <0.30    Estimated Creatinine Clearance: 53.1 ml/min (by C-G formula based on Cr of 1.36).   Medical History: Past Medical History  Diagnosis Date  . Mixed hyperlipidemia   . Essential hypertension, benign   . Schizophrenia   . Positive PPD     Treated  . DM2 (diabetes mellitus, type 2)   . Diverticulosis     Pancolonic   . Hemorrhoids   . PVC's (premature ventricular contractions)   . Syncope   . Mentally challenged   . History of GI diverticular bleed     Medications:  Scheduled:    . [COMPLETED] albuterol  5 mg Nebulization Once  . [COMPLETED] ipratropium  0.5 mg Nebulization Once    Assessment: Ok for protocol  Goal of Therapy:  Heparin level 0.3-0.7 units/ml Monitor platelets by anticoagulation protocol: Yes   Plan:  Give 4000 units bolus x 1 Start heparin infusion at 1350 units/hr Check anti-Xa level in 6 hours and daily while on heparin Continue to monitor H&H and platelets  Raquel Marrio, Zakkary Thibault Bennett 06/01/2012,4:45 PM

## 2012-06-01 NOTE — ED Notes (Signed)
Heparin stopped as per Dr Effie Shy at this time.

## 2012-06-01 NOTE — ED Notes (Signed)
Pt states he is feeling better. NAD noted at this time. No needs voiced.

## 2012-06-01 NOTE — H&P (Signed)
[facility] History and Physical  SKIPPER DACOSTA MVH:[id number] DOB: 03-28-1941 DOA: 06/01/2012  Referring physician: Wonda Olds PCP: Margorie John, MD  Specialists: none  Chief Complaint: Cough, Dyspnea  HPI: Alejandro [name] is a 71 y.o. male with a past medical history significant for diabetes, schizophrenia, , diverticulosis with prior diverticular bleed, a recent evaluation by Dr. Karilyn Cota with gastroenterology for dysphagia who presents to the emergency department complaining of one to 2 days of worsening shortness of breath, cough, congestion, fever, and weakness. On arrival to the emergency department his oxygen saturation before supplemental O2 was 76%. It was no evidence of specific infiltrate on plain film x-ray chest, but he had an elevated d-dimer. Empiric heparin was started because of the patient's high probability and a CT angiogram was ordered which was negative. Influenza PCR came back positive in ED. 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 Given the patient's persistent hypoxia without nasal cannula oxygen with minimal exertion admission was requested for observation for potential further workup of the thyroid mass. The patient is a poor source of history he has a caregiver at his bedside who can provide some details leading to his health condition. The patient lives in a group home/assisted living facility in [place].  Review of Systems: unable to obtain from patient due to cognitive reasons.  Past Medical History  Diagnosis Date  . Mixed hyperlipidemia   . Essential hypertension, benign   . Schizophrenia   . Positive PPD     Treated  . DM2 (diabetes mellitus, type 2)   . Diverticulosis     Pancolonic   . Hemorrhoids    . PVC's (premature ventricular contractions)   . Syncope   . Mentally challenged   . History of GI diverticular bleed    Past Surgical History  Procedure Date  . Colonoscopy  07/11/2004     Rehman-Pancolonic diverticulosis/Small external hemorrhoids/ The colon was full of coffee-ground material, but no active bleeding  . Esophagogastroduodenoscopy 07/11/2004    Rehman-incomplete ring GEJ, Small sliding hiatal hernia/Bulbar duodenitis without stigmata of bleeding  . Givens capsule study 01/10/2005    Rehman-Few petechiae noted at involving gastric mucosa as well as duodenum and  jejunum/ the  surface of the small bowel mucosa could not be seen because of food   debris limiting the quality of this study.  . Colonoscopy 03/29/2008    Large mouth ascending colon and sigmoid colon diverticula which were frequent.  Otherwise, no polyps, masses, inflammatory changes or AVM seen. /Small internal hemorrhoids, otherwise normal retroflexed view of the rectum  . Arm surgery     left  . Esophagogastroduodenoscopy (egd) with esophageal dilation 04/17/2012    Procedure: ESOPHAGOGASTRODUODENOSCOPY (EGD) WITH ESOPHAGEAL DILATION;  Surgeon: West Bali, MD;  Location: [facility];  Service: Endoscopy;  Laterality: N/A;  11:30   Social History:  reports that he has been smoking Cigarettes.  He has a 10 pack-year smoking history. He does not have any smokeless tobacco history on file. He reports that he does not drink alcohol or use illicit drugs. [facility], [facility], assisted living.  No Known Allergies  Family History  Problem Relation Age of Onset  . Stroke Father   . Stroke Mother   .  CAD Mother     Prior to Admission medications   Medication Sig Start Date End Date Taking? Authorizing Provider  albuterol (PROAIR HFA) 108 (90 BASE) MCG/ACT inhaler Inhale 2 puffs into the lungs every 4 (four) hours as needed. FOR SHORTNESS OF BREATH   Yes Historical Provider, MD  amLODipine  (NORVASC) 5 MG tablet Take 5 mg by mouth daily.     Yes Historical Provider, MD  Ascorbic Acid (VITAMIN C) 1000 MG tablet Take 1,000 mg by mouth daily.   Yes Historical Provider, MD  aspirin (ASPIR-LOW) 81 MG EC tablet Take 81 mg by mouth daily.     Yes Historical Provider, MD  Cholecalciferol (VITAMIN D3) 1200 UNIT/15ML LIQD Take 2 drops by mouth daily.   Yes Historical Provider, MD  ciprofloxacin (CIPRO) 500 MG tablet Take 500 mg by mouth 2 (two) times daily. FOR  7 DAYS 05/28/12 06/03/12 Yes Historical Provider, MD  dutasteride (AVODART) 0.5 MG capsule Take 0.5 mg by mouth daily.     Yes Historical Provider, MD  econazole nitrate 1 % cream Apply 1 application topically 2 (two) times daily.  12/23/11  Yes Historical Provider, MD  fish oil-omega-3 fatty acids 1000 MG capsule Take 1 g by mouth 2 (two) times daily.   Yes Historical Provider, MD  fluticasone (FLONASE) 50 MCG/ACT nasal spray Place 1 spray into the nose daily.  01/23/12  Yes Historical Provider, MD  lisinopril-hydrochlorothiazide (PRINZIDE,ZESTORETIC) 20-12.5 MG per tablet Take 2 tablets by mouth daily.    Yes Historical Provider, MD  lovastatin (MEVACOR) 40 MG tablet Take 40 mg by mouth at bedtime.    Yes Historical Provider, MD  metFORMIN (GLUCOPHAGE) 500 MG tablet Take 500 mg by mouth daily. IF BLOOD SUGAR IS 150 FOR 3 WEEKS, INCREASE TO ONE TABLET TWICE DAILY   Yes Historical Provider, MD  metoprolol succinate (TOPROL XL) 25 MG 24 hr tablet Take 25 mg by mouth daily.     Yes Historical Provider, MD  Multiple Vitamins-Minerals (MULTIVITAMIN WITH MINERALS) tablet Take 1 tablet by mouth daily.   Yes Historical Provider, MD  omeprazole (PRILOSEC) 20 MG capsule Take 20 mg by mouth every morning. 1 po every morning 04/17/12  Yes West Bali, MD  risperiDONE (RISPERDAL) 3 MG tablet Take 3 mg by mouth 2 (two) times daily.    Yes Historical Provider, MD  Tamsulosin HCl (FLOMAX) 0.4 MG CAPS Take by mouth daily.     Yes Historical Provider, MD   traZODone (DESYREL) 150 MG tablet  01/28/12  Yes Historical Provider, MD  acetaminophen (TYLENOL) 325 MG tablet Take 650 mg by mouth every 6 (six) hours as needed. Pain.    Historical Provider, MD   Physical Exam: Filed Vitals:   06/01/12 1850 06/01/12 1923 06/01/12 2042 06/01/12 2155  BP: 120/82   105/65  Pulse: 86   86  Temp:   99.3 F (37.4 C)   TempSrc:   Oral   Resp:    28  Height:      Weight:      SpO2: 97% 97%  96%     General:  Nontoxic appearing alert and oriented  Eyes: Conjunctivae were red he was reactive  ENT: Dry mucous membranes congestion clear in his nose, no pharyngeal exudates  Neck: Supple I cannot palpate a mass standing from the thyroid gland he does have anterior bogginess of the entire thyroid and slight asymmetry- trachea appears to be midline  Cardiovascular: Irregular rate and rhythm S1-S2 no murmurs rubs  or gallops  Respiratory: Clear to auscultation, upper airway rhonchi cleared with cough  Abdomen: Soft nontender  Skin: No rashes or lesions  Musculoskeletal: Normal muscle bulk tone  Psychiatric: Flat affect, no behavioral derangement, slow to speak but his verbal, the level if his cognition is unclear  Neurologic: Nonfocal  Labs on Admission:  Basic Metabolic Panel:  Lab 06/01/12 1610  NA 136  K 3.4*  CL 96  CO2 29  GLUCOSE 106*  BUN 23  CREATININE 1.36*  CALCIUM 9.6  MG --  PHOS --   Liver Function Tests: No results found for this basename: AST:5,ALT:5,ALKPHOS:5,BILITOT:5,PROT:5,ALBUMIN:5 in the last 168 hours No results found for this basename: LIPASE:5,AMYLASE:5 in the last 168 hours No results found for this basename: AMMONIA:5 in the last 168 hours CBC:  Lab 06/01/12 1513  WBC 7.7  NEUTROABS 5.8  HGB 13.2  HCT 39.5  MCV 80.9  PLT 180   Cardiac Enzymes:  Lab 06/01/12 1513  CKTOTAL --  CKMB --  CKMBINDEX --  TROPONINI <0.30    BNP (last 3 results)  Basename 06/01/12 1950  PROBNP 113.5   CBG: No  results found for this basename: GLUCAP:5 in the last 168 hours  Radiological Exams on Admission: Ct Angio Chest Pe W/cm &/or Wo Cm  06/01/2012  *RADIOLOGY REPORT*  Clinical Data: Short of breath  CT ANGIOGRAPHY CHEST  Technique:  Multidetector CT imaging of the chest using the standard protocol during bolus administration of intravenous contrast. Multiplanar reconstructed images including MIPs were obtained and reviewed to evaluate the vascular anatomy.  Contrast: OMNIPAQUE IOHEXOL 350 MG/ML SOLN  Comparison: None.  Findings: Respiratory motion artifact limits the study.  No filling defects in the pulmonary arterial tree to suggest acute pulmonary thromboembolism.  Negative abnormal mediastinal adenopathy by measurement criteria. The right lobe of the thyroid gland is markedly enlarged, is heterogeneous, and extends into the mediastinum worrisome for a large nodule.  No pneumothorax.  No pleural effusion.  Bronchiolectasis at the left lung base.  Thickening of the airways in the left lower lobe.  Heterogeneous opacities at the left base are present worrisome for inflammatory process.  Of chronic changes at the right apex.  Images of the upper abdomen demonstrate diffuse hepatic steatosis and cholelithiasis.  Incompletely imaged cystic lesions of the kidneys are present. Indeterminate 1.9 cm left adrenal nodule.  Abdominal findings are stable compared with 05/11/2009.  IMPRESSION: No evidence of acute pulmonary thromboembolism.  Findings worrisome for a large right thyroid mass extending into the mediastinum.  Thyroid ultrasound is recommended.  Inflammatory changes at the left lung base as described.  Stable abdominal findings including cholelithiasis.   Original Report Authenticated By: Jolaine Click, M.D.    Dg Chest Portable 1 View  06/01/2012  *RADIOLOGY REPORT*  Clinical Data: Shortness of breath.  PORTABLE CHEST - 1 VIEW  Comparison: Chest x-ray 07/10/2004.  Findings: Lung volumes are slightly  low.  There are bibasilar opacities, favored to predominately reflect subsegmental atelectasis (underlying air space consolidation is difficult to entirely exclude, but is not strongly favored).  No definite pleural effusions.  Crowding of the pulmonary vasculature, accentuated by low lung volumes, without frank pulmonary edema. Heart size is normal.  Mediastinal contours are unremarkable. Atherosclerosis of the thoracic aorta.  IMPRESSION: 1.  Low lung volumes with bibasilar subsegmental atelectasis. 2.  Atherosclerosis.   Original Report Authenticated By: Trudie Reed, M.D.     EKG: Independently reviewed. Sinus tachycardia. Occasional PVC.  Assessment/Plan Principal Problem:  *  Hypoxia Active Problems:  HYPERTENSION, BENIGN ESSENTIAL  Dysphagia  Schizophrenia  Diabetes mellitus  Hyperlipidemia  Diverticulosis  Acute renal insufficiency  Hypokalemia  Thyroid mass  Cholelithiasis  Hepatic steatosis  This is a 71 year old gentleman who has diabetes hyperlipidemia and schizophrenia with probable cognitive delay which has not been fully characterized it as tested positive for influenza A in the emergency department which is consistent with his presenting complaints. The reason for admission is that he remains persistently hypoxic off of supplemental O2 without a clear explanation on chest x-ray or CT of the chest. The CT of his chest does show some mild laboratory changes in the left lung base is not characteristically an infiltrate per the radiologist. His ABG shows mild hypoxia and slight respiratory alkalosis. He is hemodynamically stable. Most concerning is the new incidental finding of a thyroid mass that extends into the mediastinum which needs prompt evaluation for malignancy.   Admitted as observation, will monitor his oxygen saturation, empiric antibiotics for the left lower lobe inflammatory changes, Tamiflu for influenza A, gentle IV fluid hydration, symptomatic treatment with  Tylenol for fever, Mucinex-DM for cough. Will not resume steroids, no wheezing on exam, no clear indication, no asthma or COPD or restrictive/inflammatory airway.   Initiate workup for this thyroid mass, ultrasound of the thyroid with TSH and free T4, check a calcitonin- will need a biopsy promptly but this can also be arranged as an outpatient.  He has a very mild renal insufficiency with a creatinine of 1.36, for this will improve with IV hydration I have held his lisinopril and HCTZ and metformin. He may have had reduced oral intake in the setting of his acute viral illness. BMET in AM.  His chronic medical problems are stable, outpatient medications resumed except as above.   Code Status: Full Code Family Communication: Discussed plan of care with patient and his caregiver at the bedside, the patient seems to be able to understand our discussion despite his limited verbal communication with me, he smiles and nods appropriately. Disposition Plan: Admitted as observation, start back to his assisted living/group home when medically stable. Time spent: 7  [facility] Pager [phone]  If 7PM-7AM, please contact night-coverage [email or web address] Password [facility] 06/01/2012, 10:36 PM

## 2012-06-01 NOTE — ED Notes (Signed)
Lab called pt flu A positive. Called & updated Eber Jones, RN

## 2012-06-02 ENCOUNTER — Observation Stay (HOSPITAL_COMMUNITY): Payer: Medicare Other

## 2012-06-02 DIAGNOSIS — J40 Bronchitis, not specified as acute or chronic: Secondary | ICD-10-CM

## 2012-06-02 DIAGNOSIS — E079 Disorder of thyroid, unspecified: Secondary | ICD-10-CM

## 2012-06-02 DIAGNOSIS — I10 Essential (primary) hypertension: Secondary | ICD-10-CM

## 2012-06-02 DIAGNOSIS — F209 Schizophrenia, unspecified: Secondary | ICD-10-CM

## 2012-06-02 DIAGNOSIS — J111 Influenza due to unidentified influenza virus with other respiratory manifestations: Secondary | ICD-10-CM

## 2012-06-02 LAB — BASIC METABOLIC PANEL
BUN: 20 mg/dL (ref 6–23)
Calcium: 8.8 mg/dL (ref 8.4–10.5)
Creatinine, Ser: 1.05 mg/dL (ref 0.50–1.35)
GFR calc Af Amer: 80 mL/min — ABNORMAL LOW (ref >90)
GFR calc non Af Amer: 69 mL/min — ABNORMAL LOW (ref >90)
Potassium: 3.5 mEq/L (ref 3.5–5.1)

## 2012-06-02 LAB — CBC
MCHC: 33.9 g/dL (ref 30.0–36.0)
Platelets: 178 10*3/uL (ref 150–400)
RDW: 15.6 % — ABNORMAL HIGH (ref 11.5–15.5)

## 2012-06-02 LAB — URINALYSIS, ROUTINE W REFLEX MICROSCOPIC
Bilirubin Urine: NEGATIVE
Hgb urine dipstick: NEGATIVE
Nitrite: NEGATIVE
Specific Gravity, Urine: 1.02 (ref 1.005–1.030)
Urobilinogen, UA: 0.2 mg/dL (ref 0.0–1.0)
pH: 5 (ref 5.0–8.0)

## 2012-06-02 MED ORDER — LEVOFLOXACIN 500 MG PO TABS: 500.0000 mg | ORAL_TABLET | Freq: Every day | ORAL | Status: AC

## 2012-06-02 MED ORDER — OSELTAMIVIR PHOSPHATE 75 MG PO CAPS: 75.0000 mg | ORAL_CAPSULE | Freq: Two times a day (BID) | ORAL | Status: DC

## 2012-06-02 MED ORDER — DEXTROSE 5 % IV SOLN
INTRAVENOUS | Status: AC
  Filled 2012-06-02: qty 500

## 2012-06-02 MED ORDER — HYDROCOD POLST-CHLORPHEN POLST 10-8 MG/5ML PO LQCR: 5.0000 mL | Freq: Two times a day (BID) | ORAL | Status: DC | PRN

## 2012-06-02 MED ORDER — DEXTROSE 5 % IV SOLN
INTRAVENOUS | Status: AC
  Filled 2012-06-02: qty 10

## 2012-06-02 NOTE — Progress Notes (Signed)
UR Chart Review Completed  

## 2012-06-02 NOTE — Progress Notes (Signed)
Called [facility], I spoke with his caregiver, she is aware of his return today to the facility. No questions at this time. I did notify her that he is ambulating with assistance at this time, and will be ok to ambulate with assistance at the facility. D/c instructions reviewed with caregiver, no questions at this time. IV d/c. Pt d/c via wheelchair by NT to his caregiver.  Sheryn Bison

## 2012-06-02 NOTE — Discharge Summary (Signed)
Physician Discharge Summary  SHAHIR KAREN ZOX:[id number] DOB: 1941-06-06 DOA: 06/01/2012  PCP: Margorie John, MD  Admit date: 06/01/2012 Discharge date: 06/02/2012  Time spent: Greater than 30 minutes  Recommendations for Outpatient Follow-up:  1. Follow with primary care physician regarding thyroid mass.   Discharge Diagnoses: 1. Influenza A. 2. Bronchitis. 3. Incidental thyroid mass, needs further investigation. 4. Hypertension. 5. Type 2 diabetes mellitus. 6. Schizophrenia. 7. Acute renal failure secondary to dehydration, resolved.   Discharge Condition: Stable and improved.  Diet recommendation: Carbohydrate modified diet.  Filed Weights   06/01/12 1431 06/01/12 2243  Weight: 93.441 kg (206 lb) 95.301 kg (210 lb 1.6 oz)    History of present illness:  This 71 year old man was admitted yesterday with symptoms of cough and dyspnea. Please see initial history as outlined below: HPI: DOMINIQ FONTAINE is a 71 y.o. male with a past medical history significant for diabetes, schizophrenia, , diverticulosis with prior diverticular bleed, a recent evaluation by Dr. Karilyn Cota with gastroenterology for dysphagia who presents to the emergency department complaining of one to 2 days of worsening shortness of breath, cough, congestion, fever, and weakness. On arrival to the emergency department his oxygen saturation before supplemental O2 was 76%. It was no evidence of specific infiltrate on plain film x-ray chest, but he had an elevated d-dimer. Empiric heparin was started because of the patient's high probability and a CT angiogram was ordered which was negative. Influenza PCR came back positive in ED. 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 Given the  patient's persistent hypoxia without nasal cannula oxygen with minimal exertion admission was requested for observation for potential further workup of the thyroid mass. The patient is a poor source of history he has a caregiver at his bedside who can provide some details leading to his health condition. The patient lives in a group home/assisted living facility in [place].  Hospital Course:  Patient was admitted overnight and improved very quickly with conservative measures, IV fluids, oxygen and he was started on antibiotics. His chest x-ray did not show any evidence of pneumonia. His d-dimer was elevated. CT endocrine the chest did not show any evidence of pulmonary embolism. It did show a large right thyroid mass extending into the mediastinum. Ultrasound of the thyroid confirm the presence of this mass measuring 4.5 cm x 3.2 cm x 3.8 cm, mixed echogenic, solid with internal blood flow. This clearly is not an acute problem and needs to be investigated further. Since he is improved from his acute illness, he is stable for discharge back to the assisted-living facility, I've spoken to the facility and stressed the importance of further investigation of this thyroid mass. I recommended that the patient have an appointment made to see his primary care physician who can coordinate this.  Procedures:  None.   Consultations:  None.  Discharge Exam: Filed Vitals:   06/02/12 1007 06/02/12 1408 06/02/12 1411 06/02/12 1446  BP: 109/66 106/67    Pulse: 72 76    Temp:  97.4 F (36.3 C)    TempSrc:  Oral    Resp:  18  20  Height:      Weight:      SpO2:  95% 94% 97%    General: He looks  systemically well. Is not toxic or septic. Cardiovascular: Heart sounds are present without gallop rhythm or murmur. Respiratory: Lung fields are entirely clear with no wheezing, crackles or bronchial breathing. He is alert and oriented. Neck: Does appear to have a thyroid mass.  Discharge  Instructions  Discharge Orders    Future Orders Please Complete By Expires   Diet - low sodium heart healthy      Increase activity slowly          Medication List     As of 06/02/2012  3:00 PM    STOP taking these medications         ciprofloxacin 500 MG tablet   Commonly known as: CIPRO      TAKE these medications         ASPIR-LOW 81 MG EC tablet   Generic drug: aspirin   Take 81 mg by mouth daily.      AVODART 0.5 MG capsule   Generic drug: dutasteride   Take 0.5 mg by mouth daily.      chlorpheniramine-HYDROcodone 10-8 MG/5ML Lqcr   Commonly known as: TUSSIONEX   Take 5 mLs by mouth every 12 (twelve) hours as needed.      econazole nitrate 1 % cream   Apply 1 application topically 2 (two) times daily.      fish oil-omega-3 fatty acids 1000 MG capsule   Take 1 g by mouth 2 (two) times daily.      FLOMAX 0.4 MG Caps   Generic drug: Tamsulosin HCl   Take by mouth daily.      fluticasone 50 MCG/ACT nasal spray   Commonly known as: FLONASE   Place 1 spray into the nose daily.      levofloxacin 500 MG tablet   Commonly known as: LEVAQUIN   Take 1 tablet (500 mg total) by mouth daily.      lisinopril-hydrochlorothiazide 20-12.5 MG per tablet   Commonly known as: PRINZIDE,ZESTORETIC   Take 2 tablets by mouth daily.      lovastatin 40 MG tablet   Commonly known as: MEVACOR   Take 40 mg by mouth at bedtime.      metFORMIN 500 MG tablet   Commonly known as: GLUCOPHAGE   Take 500 mg by mouth daily. IF BLOOD SUGAR IS 150 FOR 3 WEEKS, INCREASE TO ONE TABLET TWICE DAILY      multivitamin with minerals tablet   Take 1 tablet by mouth daily.      NORVASC 5 MG tablet   Generic drug: amLODipine   Take 5 mg by mouth daily.      omeprazole 20 MG capsule   Commonly known as: PRILOSEC   Take 20 mg by mouth every morning. 1 po every morning      oseltamivir 75 MG capsule   Commonly known as: TAMIFLU   Take 1 capsule (75 mg total) by mouth 2 (two) times daily.       PROAIR HFA 108 (90 BASE) MCG/ACT inhaler   Generic drug: albuterol   Inhale 2 puffs into the lungs every 4 (four) hours as needed. FOR SHORTNESS OF BREATH      RISPERDAL 3 MG tablet   Generic drug: risperiDONE   Take 3 mg by mouth 2 (two) times daily.      TOPROL XL 25 MG 24 hr tablet   Generic drug: metoprolol succinate   Take 25 mg by mouth daily.      traZODone 150 MG tablet   Commonly  known as: DESYREL      TYLENOL 325 MG tablet   Generic drug: acetaminophen   Take 650 mg by mouth every 6 (six) hours as needed. Pain.      vitamin C 1000 MG tablet   Take 1,000 mg by mouth daily.      Vitamin D3 1200 UNIT/15ML Liqd   Take 2 drops by mouth daily.          The results of significant diagnostics from this hospitalization (including imaging, microbiology, ancillary and laboratory) are listed below for reference.    Significant Diagnostic Studies: Ct Angio Chest Pe W/cm &/or Wo Cm  06/01/2012  *RADIOLOGY REPORT*  Clinical Data: Short of breath  CT ANGIOGRAPHY CHEST  Technique:  Multidetector CT imaging of the chest using the standard protocol during bolus administration of intravenous contrast. Multiplanar reconstructed images including MIPs were obtained and reviewed to evaluate the vascular anatomy.  Contrast: OMNIPAQUE IOHEXOL 350 MG/ML SOLN  Comparison: None.  Findings: Respiratory motion artifact limits the study.  No filling defects in the pulmonary arterial tree to suggest acute pulmonary thromboembolism.  Negative abnormal mediastinal adenopathy by measurement criteria. The right lobe of the thyroid gland is markedly enlarged, is heterogeneous, and extends into the mediastinum worrisome for a large nodule.  No pneumothorax.  No pleural effusion.  Bronchiolectasis at the left lung base.  Thickening of the airways in the left lower lobe.  Heterogeneous opacities at the left base are present worrisome for inflammatory process.  Of chronic changes at the right apex.   Images of the upper abdomen demonstrate diffuse hepatic steatosis and cholelithiasis.  Incompletely imaged cystic lesions of the kidneys are present. Indeterminate 1.9 cm left adrenal nodule.  Abdominal findings are stable compared with 05/11/2009.  IMPRESSION: No evidence of acute pulmonary thromboembolism.  Findings worrisome for a large right thyroid mass extending into the mediastinum.  Thyroid ultrasound is recommended.  Inflammatory changes at the left lung base as described.  Stable abdominal findings including cholelithiasis.   Original Report Authenticated By: Jolaine Click, M.D.    US Soft Tissue Head/neck  06/02/2012  *RADIOLOGY REPORT*  Clinical Data: Thyroid mass  THYROID ULTRASOUND  Technique: Ultrasound examination of the thyroid gland and adjacent soft tissues was performed.  Comparison:  Chest CT - 06/01/2012  Findings:  There is relative homogeneity of the thyroid parenchymal echotexture.  Right thyroid lobe:  Asymmetrically enlarged measuring approximately 6.5 x 3.7 x 4.0 cm Left thyroid lobe:  4.2 x 1.8 x 2.0 cm Isthmus:  0.2 cm in diameter  Focal nodules:  Right mid, inferior - 4.5 x 3.2 x 3.8 cm - mixed echogenic, solid with internal blood flow - this mass correlates with the findings on recent chest CT.  Lymphadenopathy:  None visualized.  IMPRESSION: There is an indeterminate approximately 4.5 cm mass nearly replacing the right lobe of the thyroid which correlates with the findings on recent chest CT.  This nodule meets size criteria for percutaneous sampling.   Original Report Authenticated By: Tacey Ruiz, MD    Dg Chest Portable 1 View  06/01/2012  *RADIOLOGY REPORT*  Clinical Data: Shortness of breath.  PORTABLE CHEST - 1 VIEW  Comparison: Chest x-ray 07/10/2004.  Findings: Lung volumes are slightly low.  There are bibasilar opacities, favored to predominately reflect subsegmental atelectasis (underlying air space consolidation is difficult to entirely exclude, but is not strongly  favored).  No definite pleural effusions.  Crowding of the pulmonary vasculature, accentuated by low lung volumes,  without frank pulmonary edema. Heart size is normal.  Mediastinal contours are unremarkable. Atherosclerosis of the thoracic aorta.  IMPRESSION: 1.  Low lung volumes with bibasilar subsegmental atelectasis. 2.  Atherosclerosis.   Original Report Authenticated By: Trudie Reed, M.D.         Labs: Basic Metabolic Panel:  Lab 06/02/12 4098 06/01/12 1513  NA 134* 136  K 3.5 3.4*  CL 97 96  CO2 26 29  GLUCOSE 241* 106*  BUN 20 23  CREATININE 1.05 1.36*  CALCIUM 8.8 9.6  MG -- --  PHOS -- --      CBC:  Lab 06/02/12 0608 06/01/12 1513  WBC 6.7 7.7  NEUTROABS -- 5.8  HGB 13.1 13.2  HCT 38.7* 39.5  MCV 81.0 80.9  PLT 178 180   Cardiac Enzymes:  Lab 06/01/12 1513  CKTOTAL --  CKMB --  CKMBINDEX --  TROPONINI <0.30   BNP: BNP (last 3 results)  Basename 06/01/12 1950  PROBNP 113.5         Signed:  Kaydn Kumpf C  [facility] 06/02/2012, 3:00 PM

## 2012-06-02 NOTE — Care Management Note (Unsigned)
    Page 1 of 1   06/02/2012     12:04:42 PM   CARE MANAGEMENT NOTE 06/02/2012  Patient:  Alejandro [name], Alejandro [name]   Account Number:  [id number]  Date Initiated:  06/02/2012  Documentation initiated by:  Sharrie Rothman  Subjective/Objective Assessment:   Pt admitted from [facility]. Pt will return to facility at discharge.     Action/Plan:   CSW will arrange discharge to facility when pt is medically stable. If pt needs HH, [facility] is the agency they use.   Anticipated DC Date:  06/04/2012   Anticipated DC Plan:  ASSISTED LIVING / REST HOME  In-house referral  Clinical Social Worker      DC Planning Services  CM consult      Choice offered to / List presented to:             Status of service:  Completed, signed off Medicare Important Message given?   (If response is "NO", the following Medicare IM given date fields will be blank) Date Medicare IM given:   Date Additional Medicare IM given:    Discharge Disposition:    Per UR Regulation:    If discussed at Long Length of Stay Meetings, dates discussed:    Comments:  06/02/12 1205 Arlyss Queen, RN BSN CM

## 2012-06-02 NOTE — Clinical Social Work Note (Signed)
Pt d/c today back to [facility]. Pt does not need private room per MD. Facility aware of influenza. Facility is agreeable to return with no FL2 due to <24 hour observation. Radiology reports included in packet at MD request for follow up of thyroid mass. Pt's sister aware and agreeable to d/c. Facility will provide transportation. D/C summary and scripts in packet as well.   Derenda Fennel, [place] [phone]

## 2012-06-02 NOTE — Progress Notes (Signed)
Subjective: This man came in with symptoms of flu. He had incidental finding of cardiomegaly. Ultrasound of the thyroid has been ordered. He feels much improved this morning.           Physical Exam: Blood pressure 109/66, pulse 72, temperature 98.2 F (36.8 C), temperature source Oral, resp. rate 20, height 5' 6.5" (1.689 m), weight 95.301 kg (210 lb 1.6 oz), SpO2 95.00%. He does not have any increased work of breathing. There is no stridor. Lung fields are clear. Oxygen saturation is 95% on room air. Heart sounds are present without gallop rhythm.   Investigations:  No results found for this or any previous visit (from the past 240 hour(s)).   Basic Metabolic Panel:  Basename 06/02/12 0608 06/01/12 1513  NA 134* 136  K 3.5 3.4*  CL 97 96  CO2 26 29  GLUCOSE 241* 106*  BUN 20 23  CREATININE 1.05 1.36*  CALCIUM 8.8 9.6  MG -- --  PHOS -- --       CBC:  Basename 06/02/12 0608 06/01/12 1513  WBC 6.7 7.7  NEUTROABS -- 5.8  HGB 13.1 13.2  HCT 38.7* 39.5  MCV 81.0 80.9  PLT 178 180    Ct Angio Chest Pe W/cm &/or Wo Cm  06/01/2012  *RADIOLOGY REPORT*  Clinical Data: Short of breath  CT ANGIOGRAPHY CHEST  Technique:  Multidetector CT imaging of the chest using the standard protocol during bolus administration of intravenous contrast. Multiplanar reconstructed images including MIPs were obtained and reviewed to evaluate the vascular anatomy.  Contrast: OMNIPAQUE IOHEXOL 350 MG/ML SOLN  Comparison: None.  Findings: Respiratory motion artifact limits the study.  No filling defects in the pulmonary arterial tree to suggest acute pulmonary thromboembolism.  Negative abnormal mediastinal adenopathy by measurement criteria. The right lobe of the thyroid gland is markedly enlarged, is heterogeneous, and extends into the mediastinum worrisome for a large nodule.  No pneumothorax.  No pleural effusion.  Bronchiolectasis at the left lung base.  Thickening of the  airways in the left lower lobe.  Heterogeneous opacities at the left base are present worrisome for inflammatory process.  Of chronic changes at the right apex.  Images of the upper abdomen demonstrate diffuse hepatic steatosis and cholelithiasis.  Incompletely imaged cystic lesions of the kidneys are present. Indeterminate 1.9 cm left adrenal nodule.  Abdominal findings are stable compared with 05/11/2009.  IMPRESSION: No evidence of acute pulmonary thromboembolism.  Findings worrisome for a large right thyroid mass extending into the mediastinum.  Thyroid ultrasound is recommended.  Inflammatory changes at the left lung base as described.  Stable abdominal findings including cholelithiasis.   Original Report Authenticated By: Jolaine Click, M.D.    Dg Chest Portable 1 View  06/01/2012  *RADIOLOGY REPORT*  Clinical Data: Shortness of breath.  PORTABLE CHEST - 1 VIEW  Comparison: Chest x-ray 07/10/2004.  Findings: Lung volumes are slightly low.  There are bibasilar opacities, favored to predominately reflect subsegmental atelectasis (underlying air space consolidation is difficult to entirely exclude, but is not strongly favored).  No definite pleural effusions.  Crowding of the pulmonary vasculature, accentuated by low lung volumes, without frank pulmonary edema. Heart size is normal.  Mediastinal contours are unremarkable. Atherosclerosis of the thoracic aorta.  IMPRESSION: 1.  Low lung volumes with bibasilar subsegmental atelectasis. 2.  Atherosclerosis.   Original Report Authenticated By: Trudie Reed, M.D.       Medications: I have reviewed the patient's current medications.  Impression:  1. Influenza. 2. Hypoxemia, resolved apparently. 3. Incidental thyroid mass, await ultrasound of the thyroid. 4. Schizophrenia. 5. Hypertension. 6. Type 2 diabetes mellitus.     Plan: 1. Continue with current therapy. 2. Await ultrasound of the thyroid. 3. Consider discharge home today.     LOS: 1  day   Wilson Singer Pager [phone]  06/02/2012, 10:48 AM

## 2012-06-02 NOTE — Clinical Social Work Psychosocial (Signed)
Clinical Social Work Department BRIEF PSYCHOSOCIAL ASSESSMENT 06/02/2012  Patient:  Alejandro [name], Alejandro [name]     Account Number:  [id number]     Admit date:  06/01/2012  Clinical Social Worker:  Nancie Neas  Date/Time:  06/02/2012 11:10 AM  Referred by:  Physician  Date Referred:  06/02/2012 Referred for  ALF Placement   Other Referral:   Interview type:  Family Other interview type:   Alejandro [name]- sister    PSYCHOSOCIAL DATA Living Status:  FACILITY Admitted from facility:   Level of care:  Assisted Living Primary support name:  Alejandro [name] Primary support relationship to patient:  SIBLING Degree of support available:   supportive    CURRENT CONCERNS Current Concerns  Post-Acute Placement   Other Concerns:    SOCIAL WORK ASSESSMENT / PLAN CSW spoke with Corrie Dandy, supervisor at [facility] where pt has been a resident for about 15 years. Corrie Dandy reports pt is a limited assist and okay to return at d/c. Facility uses [facility] health if needed at d/c. Corrie Dandy also provided contact information for pt's sister Alejandro [name]. This was added to chart. Alejandro [name] reports she lives in [place], but is able to visit fairly frequently. She is very pleased with care at [facility] and requests pt to return there.   Assessment/plan status:  Psychosocial Support/Ongoing Assessment of Needs Other assessment/ plan:   Information/referral to community resources:   [facility]    PATIENT'S/FAMILY'S RESPONSE TO PLAN OF CARE: Pt unable to discuss plan of care at this time as he is asleep. Family report very positive feelings regarding return to [facility] when pt is medically stable. CSW to continue to follow.        Derenda Fennel, [place] [phone]

## 2012-06-05 LAB — CALCITONIN: Calcitonin: 2 pg/mL (ref <11)

## 2012-08-07 ENCOUNTER — Encounter: Payer: Self-pay | Admitting: *Deleted

## 2012-09-11 ENCOUNTER — Encounter: Payer: Self-pay | Admitting: Gastroenterology

## 2012-09-16 ENCOUNTER — Encounter: Payer: Self-pay | Admitting: Gastroenterology

## 2012-09-16 ENCOUNTER — Ambulatory Visit (INDEPENDENT_AMBULATORY_CARE_PROVIDER_SITE_OTHER): Payer: Medicare Other | Admitting: Gastroenterology

## 2012-09-16 VITALS — BP 128/75 | HR 78 | Temp 98.3°F | Ht 67.0 in | Wt 217.4 lb

## 2012-09-16 DIAGNOSIS — R1310 Dysphagia, unspecified: Secondary | ICD-10-CM

## 2012-09-16 DIAGNOSIS — Z1211 Encounter for screening for malignant neoplasm of colon: Secondary | ICD-10-CM | POA: Insufficient documentation

## 2012-09-16 NOTE — Progress Notes (Signed)
Subjective:    Patient ID: Alejandro [name], male    DOB: 1940-10-29, 72 y.o.   MRN: [id number]  PCP: BERGER  HPI FEEL SPRETTY GOOD TODAY. DOING PRETTY GOOD WITH MEDS. PT DENIES FEVER, CHILLS, BRBPR, nausea, vomiting, melena, diarrhea, constipation, abd pain, problems swallowing, problems with sedation IN OCT 2013, heartburn or indigestion.  Past Medical History  Diagnosis Date  . Mixed hyperlipidemia   . Essential hypertension, benign   . Schizophrenia   . Positive PPD     Treated  . DM2 (diabetes mellitus, type 2)   . Diverticulosis     Pancolonic   . Hemorrhoids   . PVC's (premature ventricular contractions)   . Syncope   . Mentally challenged   . History of GI diverticular bleed    Past Surgical History  Procedure Laterality Date  . Colonoscopy   07/11/2004     Rehman-Pancolonic diverticulosis/Small external hemorrhoids/ The colon was full of coffee-ground material, but no active bleeding  . Esophagogastroduodenoscopy  07/11/2004    Rehman-incomplete ring GEJ, Small sliding hiatal hernia/Bulbar duodenitis without stigmata of bleeding  . Givens capsule study  01/10/2005    Rehman-Few petechiae noted at involving gastric mucosa as well as duodenum and  jejunum/ the  surface of the small bowel mucosa could not be seen because of food   debris limiting the quality of this study.  . Colonoscopy  03/29/2008    Large mouth ascending colon and sigmoid colon diverticula which were frequent.  Otherwise, no polyps, masses, inflammatory changes or AVM seen. /Small internal hemorrhoids, otherwise normal retroflexed view of the rectum  . Arm surgery      left  . Esophagogastroduodenoscopy (egd) with esophageal dilation  04/17/2012    GMW:NUUVOZDG ZENKER'S DIVERTICULUM OR PRIMARY ESOPHGEAL MOILITY DISORDER/MODERATE Erosive gastritis/MILD Duodenal inflammation    No Known Allergies  Current Outpatient Prescriptions  Medication Sig Dispense Refill  . acetaminophen (TYLENOL) 325 MG tablet  Take 650 mg by mouth every 6 (six) hours as needed. Pain.      Marland Kitchen albuterol (PROAIR HFA) 108 (90 BASE) MCG/ACT inhaler Inhale 2 puffs into the lungs every 4 (four) hours as needed. FOR SHORTNESS OF BREATH      . amLODipine (NORVASC) 5 MG tablet Take 5 mg by mouth daily.        . Ascorbic Acid (VITAMIN C) 1000 MG tablet Take 1,000 mg by mouth daily.      Marland Kitchen aspirin (ASPIR-LOW) 81 MG EC tablet Take 81 mg by mouth daily.        . Cholecalciferol (VITAMIN D3) 1200 UNIT/15ML LIQD Take 2 drops by mouth daily.      .        . econazole nitrate 1 % cream Apply 1 application topically 2 (two) times daily.       . fish oil-omega-3 fatty acids 1000 MG capsule Take 1 g by mouth 2 (two) times daily.      . fluticasone (FLONASE) 50 MCG/ACT nasal spray Place 1 spray into the nose daily.       Marland Kitchen lisinopril-hydrochlorothiazide (PRINZIDE,ZESTORETIC) 20-12.5 MG per tablet Take 2 tablets by mouth daily.       Marland Kitchen lovastatin (MEVACOR) 40 MG tablet Take 40 mg by mouth at bedtime.       . meloxicam (MOBIC) 15 MG tablet Take 15 mg by mouth daily.      . metFORMIN (GLUCOPHAGE) 500 MG tablet Take 500 mg by mouth daily. IF BLOOD SUGAR IS 150 FOR 3 WEEKS,  INCREASE TO ONE TABLET TWICE DAILY      . metoprolol succinate (TOPROL XL) 25 MG 24 hr tablet Take 25 mg by mouth daily.        . Multiple Vitamins-Minerals (MULTIVITAMIN WITH MINERALS) tablet Take 1 tablet by mouth daily.      Marland Kitchen omeprazole (PRILOSEC) 20 MG capsule Take 20 mg by mouth every morning. 1 po every morning      . risperiDONE (RISPERDAL) 3 MG tablet Take 3 mg by mouth 2 (two) times daily.       . Tamsulosin HCl (FLOMAX) 0.4 MG CAPS Take by mouth daily.        . traZODone (DESYREL) 150 MG tablet       . chlorpheniramine-HYDROcodone (TUSSIONEX) 10-8 MG/5ML LQCR Take 5 mLs by mouth every 12 (twelve) hours as needed.  140 mL  0  . oseltamivir (TAMIFLU) 75 MG capsule Take 1 capsule (75 mg total) by mouth 2 (two) times daily.  8 capsule  0      Review of Systems      Objective:   Physical Exam  Vitals reviewed. Constitutional: He is oriented to person, place, and time. He appears well-nourished. No distress.  HENT:  Head: Normocephalic and atraumatic.  Mouth/Throat: Oropharynx is clear and moist. No oropharyngeal exudate.  Eyes: Pupils are equal, round, and reactive to light. No scleral icterus.  Neck: Normal range of motion. Neck supple.  Cardiovascular: Normal rate, regular rhythm and normal heart sounds.   Pulmonary/Chest: Effort normal and breath sounds normal. No respiratory distress.  Abdominal: Bowel sounds are normal. He exhibits no distension. There is no tenderness.  Musculoskeletal: He exhibits no edema.  Lymphadenopathy:    He has no cervical adenopathy.  Neurological: He is alert and oriented to person, place, and time.  NO  NEW FOCAL DEFICITS   Psychiatric: He has a normal mood and affect.          Assessment & Plan:

## 2012-09-16 NOTE — Assessment & Plan Note (Addendum)
SX RESOLVED AFTER EGD/DIL/OMEPRAZOLE. PT GAINED 13 LBS SINCE OCT 2013.  CONTINUE OMEPRAZOLE. LOSE 13 LBS DIABETIC/LOW FTA DIET(< 2200 CAL/DAY) OPV IN 6 MOS

## 2012-09-16 NOTE — Progress Notes (Signed)
Cc PCP 

## 2012-09-16 NOTE — Assessment & Plan Note (Signed)
NO NEED FOR ROUTINE SCREENING TCS AFTER AGE 72

## 2012-09-16 NOTE — Patient Instructions (Signed)
FOLLOW A LOW FAT/DIABETIC DIET. YOU SHOULD NOT EAT MORE THAN 2200 CALORIES A DAY.  Use Prilosec 30 minutes prior to your first meal.  LOSE 13 LBS.  FOLLOW UP IN 6 MOS.

## 2012-09-17 NOTE — Progress Notes (Signed)
Reminder in epic °

## 2013-01-05 ENCOUNTER — Emergency Department (HOSPITAL_COMMUNITY): Payer: Medicare Other

## 2013-01-05 ENCOUNTER — Encounter (HOSPITAL_COMMUNITY): Payer: Self-pay | Admitting: *Deleted

## 2013-01-05 ENCOUNTER — Emergency Department (HOSPITAL_COMMUNITY)
Admission: EM | Admit: 2013-01-05 | Discharge: 2013-01-05 | Disposition: A | Discharge status: Home / Self Care | Payer: Medicare Other | Attending: Emergency Medicine | Admitting: Emergency Medicine

## 2013-01-05 DIAGNOSIS — Z79899 Other long term (current) drug therapy: Secondary | ICD-10-CM | POA: Insufficient documentation

## 2013-01-05 DIAGNOSIS — Z8659 Personal history of other mental and behavioral disorders: Secondary | ICD-10-CM | POA: Insufficient documentation

## 2013-01-05 DIAGNOSIS — R110 Nausea: Secondary | ICD-10-CM | POA: Insufficient documentation

## 2013-01-05 DIAGNOSIS — E782 Mixed hyperlipidemia: Secondary | ICD-10-CM | POA: Insufficient documentation

## 2013-01-05 DIAGNOSIS — I10 Essential (primary) hypertension: Secondary | ICD-10-CM | POA: Insufficient documentation

## 2013-01-05 DIAGNOSIS — Z791 Long term (current) use of non-steroidal anti-inflammatories (NSAID): Secondary | ICD-10-CM | POA: Insufficient documentation

## 2013-01-05 DIAGNOSIS — R198 Other specified symptoms and signs involving the digestive system and abdomen: Secondary | ICD-10-CM | POA: Insufficient documentation

## 2013-01-05 DIAGNOSIS — R0602 Shortness of breath: Secondary | ICD-10-CM | POA: Insufficient documentation

## 2013-01-05 DIAGNOSIS — F209 Schizophrenia, unspecified: Secondary | ICD-10-CM | POA: Insufficient documentation

## 2013-01-05 DIAGNOSIS — K5900 Constipation, unspecified: Secondary | ICD-10-CM | POA: Insufficient documentation

## 2013-01-05 DIAGNOSIS — Z7982 Long term (current) use of aspirin: Secondary | ICD-10-CM | POA: Insufficient documentation

## 2013-01-05 DIAGNOSIS — E119 Type 2 diabetes mellitus without complications: Secondary | ICD-10-CM | POA: Insufficient documentation

## 2013-01-05 DIAGNOSIS — F17200 Nicotine dependence, unspecified, uncomplicated: Secondary | ICD-10-CM | POA: Insufficient documentation

## 2013-01-05 DIAGNOSIS — Z8679 Personal history of other diseases of the circulatory system: Secondary | ICD-10-CM | POA: Insufficient documentation

## 2013-01-05 DIAGNOSIS — Z8719 Personal history of other diseases of the digestive system: Secondary | ICD-10-CM | POA: Insufficient documentation

## 2013-01-05 LAB — COMPREHENSIVE METABOLIC PANEL
ALT: 16 U/L (ref 0–53)
AST: 17 U/L (ref 0–37)
Albumin: 3.5 g/dL (ref 3.5–5.2)
Alkaline Phosphatase: 48 U/L (ref 39–117)
Chloride: 97 mEq/L (ref 96–112)
Potassium: 3.3 mEq/L — ABNORMAL LOW (ref 3.5–5.1)
Sodium: 136 mEq/L (ref 135–145)
Total Bilirubin: 0.8 mg/dL (ref 0.3–1.2)

## 2013-01-05 LAB — CBC
MCH: 28.1 pg (ref 26.0–34.0)
MCV: 83.4 fL (ref 78.0–100.0)
Platelets: 191 10*3/uL (ref 150–400)
RBC: 4.95 MIL/uL (ref 4.22–5.81)
RDW: 14.7 % (ref 11.5–15.5)

## 2013-01-05 LAB — URINALYSIS, ROUTINE W REFLEX MICROSCOPIC
Bilirubin Urine: NEGATIVE
Specific Gravity, Urine: 1.02 (ref 1.005–1.030)
Urobilinogen, UA: 0.2 mg/dL (ref 0.0–1.0)

## 2013-01-05 LAB — URINE MICROSCOPIC-ADD ON

## 2013-01-05 LAB — OCCULT BLOOD, POC DEVICE: Fecal Occult Bld: NEGATIVE

## 2013-01-05 LAB — POCT I-STAT TROPONIN I

## 2013-01-05 MED ORDER — IOHEXOL 300 MG/ML  SOLN
100.0000 mL | Freq: Once | INTRAMUSCULAR | Status: AC | PRN
  Administered 2013-01-05: 100 mL via INTRAVENOUS

## 2013-01-05 MED ORDER — IOHEXOL 300 MG/ML  SOLN
50.0000 mL | Freq: Once | INTRAMUSCULAR | Status: AC | PRN
  Administered 2013-01-05: 50 mL via ORAL

## 2013-01-05 NOTE — ED Notes (Signed)
Patient with no complaints at this time. Respirations even and unlabored. Skin warm/dry. Discharge instructions reviewed with patient at this time. Patient given opportunity to voice concerns/ask questions. IV removed per policy and band-aid applied to site. Patient discharged at this time and left Emergency Department with steady gait.  

## 2013-01-05 NOTE — ED Provider Notes (Signed)
CSN: [id number]     Arrival date & time 01/05/13  1191 History  This chart was scribed for Ashby Dawes, MD, by Yevette Edwards, ED Scribe. This patient was seen in room APA15/[facility]15 and the patient's care was started at 9:59 AM.   First MD Initiated Contact with Patient 01/05/13 0957     Chief Complaint  Patient presents with  . Constipation    The history is provided by the patient, a caregiver and the nursing home. The history is limited by a developmental delay.  HPI Comments: TREYON WYMORE is a 72 y.o. male, with a h/o diverticulosis, who presents to the Emergency Department complaining of constipation which has been occurring for four days. The pt usually has a BM daily. He visited his PCP yesterday where he was prescribed Miralax, but it provided no resolution.  He reports that he has also experienced constant, non-radiating abdominal pain in addition to the constipation.  The pt describes the abdominal pain as "crampy."  The pt states that he is not having flatulance.  The pt's caretakers state he has also experienced nausea. The caretakers deny that he has experienced a fever or emesis. They  report that he has experienced a prior episode of constipation which resolved without hospital intervention.  Pt noted to have some dyspnea today. No chest pain.  No cough.  Dr. Merilynn Finland is PCP.  Past Medical History  Diagnosis Date  . Mixed hyperlipidemia   . Essential hypertension, benign   . Schizophrenia   . Positive PPD     Treated  . DM2 (diabetes mellitus, type 2)   . Diverticulosis     Pancolonic   . Hemorrhoids   . PVC's (premature ventricular contractions)   . Syncope   . Mentally challenged   . History of GI diverticular bleed    Past Surgical History  Procedure Laterality Date  . Colonoscopy   07/11/2004     Rehman-Pancolonic diverticulosis/Small external hemorrhoids/ The colon was full of coffee-ground material, but no active bleeding  .  Esophagogastroduodenoscopy  07/11/2004    Rehman-incomplete ring GEJ, Small sliding hiatal hernia/Bulbar duodenitis without stigmata of bleeding  . Givens capsule study  01/10/2005    Rehman-Few petechiae noted at involving gastric mucosa as well as duodenum and  jejunum/ the  surface of the small bowel mucosa could not be seen because of food   debris limiting the quality of this study.  . Colonoscopy  03/29/2008    Large mouth ascending colon and sigmoid colon diverticula which were frequent.  Otherwise, no polyps, masses, inflammatory changes or AVM seen. /Small internal hemorrhoids, otherwise normal retroflexed view of the rectum  . Arm surgery      left  . Esophagogastroduodenoscopy (egd) with esophageal dilation  04/17/2012    YNW:GNFAOZHY ZENKER'S DIVERTICULUM OR PRIMARY ESOPHGEAL MOILITY DISORDER/MODERATE Erosive gastritis/MILD Duodenal inflammation    Family History  Problem Relation Age of Onset  . Stroke Father   . Stroke Mother   . CAD Mother    History  Substance Use Topics  . Smoking status: Current Every Day Smoker -- 0.50 packs/day for 20 years    Types: Cigarettes  . Smokeless tobacco: Not on file  . Alcohol Use: No   ROS is limited due to patient poor historian. Review of Systems  Constitutional: Negative for fever.  Respiratory: Positive for shortness of breath.   Gastrointestinal: Positive for nausea and constipation. Negative for vomiting.  All other systems reviewed and are negative.  Allergies  Review of patient's allergies indicates no known allergies.  Home Medications   Current Outpatient Rx  Name  Route  Sig  Dispense  Refill  . acetaminophen (TYLENOL) 325 MG tablet   Oral   Take 650 mg by mouth every 6 (six) hours as needed. Pain.         Marland Kitchen albuterol (PROAIR HFA) 108 (90 BASE) MCG/ACT inhaler   Inhalation   Inhale 2 puffs into the lungs every 4 (four) hours as needed. FOR SHORTNESS OF BREATH         . amLODipine (NORVASC) 5 MG tablet    Oral   Take 5 mg by mouth daily.           . Ascorbic Acid (VITAMIN C) 1000 MG tablet   Oral   Take 1,000 mg by mouth daily.         Marland Kitchen aspirin (ASPIR-LOW) 81 MG EC tablet   Oral   Take 81 mg by mouth daily.           . chlorpheniramine-HYDROcodone (TUSSIONEX) 10-8 MG/5ML LQCR   Oral   Take 5 mLs by mouth every 12 (twelve) hours as needed.   140 mL   0   . Cholecalciferol (VITAMIN D3) 1200 UNIT/15ML LIQD   Oral   Take 2 drops by mouth daily.         Marland Kitchen dutasteride (AVODART) 0.5 MG capsule   Oral   Take 0.5 mg by mouth daily.           Marland Kitchen econazole nitrate 1 % cream   Topical   Apply 1 application topically 2 (two) times daily.          . fish oil-omega-3 fatty acids 1000 MG capsule   Oral   Take 1 g by mouth 2 (two) times daily.         . fluticasone (FLONASE) 50 MCG/ACT nasal spray   Nasal   Place 1 spray into the nose daily.          Marland Kitchen lisinopril-hydrochlorothiazide (PRINZIDE,ZESTORETIC) 20-12.5 MG per tablet   Oral   Take 2 tablets by mouth daily.          Marland Kitchen lovastatin (MEVACOR) 40 MG tablet   Oral   Take 40 mg by mouth at bedtime.          . meloxicam (MOBIC) 15 MG tablet   Oral   Take 15 mg by mouth daily.         . metFORMIN (GLUCOPHAGE) 500 MG tablet   Oral   Take 500 mg by mouth daily. IF BLOOD SUGAR IS 150 FOR 3 WEEKS, INCREASE TO ONE TABLET TWICE DAILY         . metoprolol succinate (TOPROL XL) 25 MG 24 hr tablet   Oral   Take 25 mg by mouth daily.           . Multiple Vitamins-Minerals (MULTIVITAMIN WITH MINERALS) tablet   Oral   Take 1 tablet by mouth daily.         Marland Kitchen omeprazole (PRILOSEC) 20 MG capsule   Oral   Take 20 mg by mouth every morning. 1 po every morning         . oseltamivir (TAMIFLU) 75 MG capsule   Oral   Take 1 capsule (75 mg total) by mouth 2 (two) times daily.   8 capsule   0   . risperiDONE (RISPERDAL) 3 MG tablet   Oral   Take  3 mg by mouth 2 (two) times daily.          . Tamsulosin  HCl (FLOMAX) 0.4 MG CAPS   Oral   Take by mouth daily.           . traZODone (DESYREL) 150 MG tablet                Triage Vitals: BP 131/72  Pulse 40  Temp(Src) 97.8 F (36.6 C) (Oral)  Resp 20  Ht 5\' 7"  (1.702 m)  Wt 212 lb (96.163 kg)  BMI 33.2 kg/m2  SpO2 96%  Physical Exam  Nursing note and vitals reviewed. Constitutional: He appears well-developed and well-nourished. No distress.  HENT:  Head: Normocephalic and atraumatic.  Eyes: EOM are normal.  Neck: Neck supple. No tracheal deviation present.  Cardiovascular: Normal rate and regular rhythm.   Frequent ectopic beats. Heart sounds normal. Intact distal pulses.   Pulmonary/Chest: Effort normal and breath sounds normal. No respiratory distress.  Tachypneic.   Abdominal: Soft. Bowel sounds are normal. He exhibits no distension. There is no tenderness.  Soft, non-tender. Non-distended. Normal active bowel sounds.   Genitourinary:  No impaction noted. No gross blood.   Musculoskeletal: Normal range of motion.  Neurological: He is alert.  Skin: Skin is warm and dry.  Psychiatric: His behavior is normal.  Flat affect.     ED Course   DIAGNOSTIC STUDIES: Oxygen Saturation is 96% on room air, normal by my interpretation.    COORDINATION OF CARE:  10:10 AM- Discussed treatment plan with patient, and the patient agreed to the plan.   Procedures (including critical care time)  Results for orders placed during the hospital encounter of 01/05/13  COMPREHENSIVE METABOLIC PANEL      Result Value Range   Sodium 136  135 - 145 mEq/L   Potassium 3.3 (*) 3.5 - 5.1 mEq/L   Chloride 97  96 - 112 mEq/L   CO2 31  19 - 32 mEq/L   Glucose, Bld 145 (*) 70 - 99 mg/dL   BUN 11  6 - 23 mg/dL   Creatinine, Ser 1.61  0.50 - 1.35 mg/dL   Calcium 9.2  8.4 - 09.6 mg/dL   Total Protein 7.8  6.0 - 8.3 g/dL   Albumin 3.5  3.5 - 5.2 g/dL   AST 17  0 - 37 U/L   ALT 16  0 - 53 U/L   Alkaline Phosphatase 48  39 - 117 U/L   Total  Bilirubin 0.8  0.3 - 1.2 mg/dL   GFR calc non Af Amer 69 (*) >90 mL/min   GFR calc Af Amer 80 (*) >90 mL/min  CBC      Result Value Range   WBC 15.1 (*) 4.0 - 10.5 K/uL   RBC 4.95  4.22 - 5.81 MIL/uL   Hemoglobin 13.9  13.0 - 17.0 g/dL   HCT 04.5  40.9 - 81.1 %   MCV 83.4  78.0 - 100.0 fL   MCH 28.1  26.0 - 34.0 pg   MCHC 33.7  30.0 - 36.0 g/dL   RDW 91.4  78.2 - 95.6 %   Platelets 191  150 - 400 K/uL  PRO B NATRIURETIC PEPTIDE      Result Value Range   Pro B Natriuretic peptide (BNP) 678.8 (*) 0 - 125 pg/mL  URINALYSIS, ROUTINE W REFLEX MICROSCOPIC      Result Value Range   Color, Urine YELLOW  YELLOW   APPearance CLEAR  CLEAR   Specific Gravity, Urine 1.020  1.005 - 1.030   pH 6.5  5.0 - 8.0   Glucose, UA NEGATIVE  NEGATIVE mg/dL   Hgb urine dipstick TRACE (*) NEGATIVE   Bilirubin Urine NEGATIVE  NEGATIVE   Ketones, ur NEGATIVE  NEGATIVE mg/dL   Protein, ur NEGATIVE  NEGATIVE mg/dL   Urobilinogen, UA 0.2  0.0 - 1.0 mg/dL   Nitrite NEGATIVE  NEGATIVE   Leukocytes, UA TRACE (*) NEGATIVE  URINE MICROSCOPIC-ADD ON      Result Value Range   WBC, UA 0-2  <3 WBC/hpf   RBC / HPF 0-2  <3 RBC/hpf   Bacteria, UA FEW (*) RARE  POCT I-STAT TROPONIN I      Result Value Range   Troponin i, poc 0.01  0.00 - 0.08 ng/mL   Comment 3            Ct Abdomen Pelvis W Contrast  01/05/2013   *RADIOLOGY REPORT*  Clinical Data: Abdominal pain with constipation.  History of hypertension, diabetes and GI bleed.  CT ABDOMEN AND PELVIS WITH CONTRAST  Technique:  Multidetector CT imaging of the abdomen and pelvis was performed following the standard protocol during bolus administration of intravenous contrast.  Contrast: 50mL OMNIPAQUE IOHEXOL 300 MG/ML  SOLN, OMNIPAQUE IOHEXOL 300 MG/ML  SOLN ; The patient was given the usual instructions regarding cessation of metformin therapy related to intravenous contrast administration and is to follow up with the ordering physician.  Comparison: Abdominal  pelvic CT 05/11/2009.  Findings: There is new mild dependent atelectasis at both lung bases.  There is no confluent airspace opacity.  There is no significant pleural or pericardial effusion.  There is no focal hepatic abnormality.  Small gallstones are again noted.  There is new gallbladder wall edema without discrete pericholecystic inflammatory change.  There is no intra or extrahepatic biliary dilatation.  A small calcification within the pancreatic head appears extrinsic to the lumen of the distal common bile duct (coronal image 52).  This is likely postinflammatory. The pancreas and pancreatic duct appear normal.  There is a stable 2.5 x 1.9 cm left adrenal nodule, likely an adenoma based on stability.  The right adrenal gland appears normal.  Renal cysts are again noted.  Some of these have enlarged, but no concerning characteristics are identified.  There is symmetric perinephric soft tissue stranding bilaterally, similar to the prior study.  No hydronephrosis or delay in contrast excretion is seen.  The stomach, small bowel and appendix appear normal.  Diverticular changes are present within the distal colon without surrounding inflammation.  There is stable aorto iliac atherosclerosis with mild dilatation of the distal aorta and the right common iliac artery.  The bladder appears normal.  There are stable dystrophic calcifications within the prostate gland.  No inflammatory changes are seen within the pelvis.  However, there is increased density within the subcutaneous fat inferior to the ischial tuberosities, slightly greater on the right.  No focal fluid collection or osseous erosion in this area.  This was not imaged previously and may represent chronic decubitus change.  Mild lumbar spine degenerative changes are stable.  IMPRESSION:  1.  New edema within the gallbladder wall associated with chronic cholelithiasis.  There is no focal surrounding inflammatory change, although cholecystitis cannot be  excluded.  Correlate clinically. 2.  No evidence of biliary dilatation. 3.  No other acute findings identified. 4.  Stable left adrenal nodule consistent with an adenoma based on stability.  5.  Enlarging renal cysts bilaterally.   Original Report Authenticated By: Carey Bullocks, M.D.   Dg Abd Acute W/chest  01/05/2013   *RADIOLOGY REPORT*  Clinical Data: Obstipation.  Dizziness.  ACUTE ABDOMEN SERIES (ABDOMEN 2 VIEW & CHEST 1 VIEW)  Comparison: CT abdomen pelvis 05/11/2009.  Portable chest x-ray and CTA chest 06/01/2012.  Findings: Bowel gas pattern unremarkable without evidence of obstruction or significant ileus.  Gas and moderate stool burden throughout normal caliber colon from cecum rectum.  No significant small bowel gas.  Stomach decompressed.  No free air on the erect image.  No visible opaque urinary tract calculi.  Phleboliths in the pelvis.  High attenuation material within what is likely a sigmoid diverticulum in the right side of mid pelvis.  Degenerative changes involving the thoracic lumbar spine.  Cardiac silhouette mildly enlarged but stable.  Suboptimal inspiration with mild atelectasis in the lung bases and in the lingula.  Lungs otherwise clear.  Pulmonary vascularity normal without pulmonary edema.  IMPRESSION:  1.  No acute abdominal abnormality. 2.  Stable mild cardiomegaly.  Suboptimal inspiration accounts for bibasilar atelectasis and atelectasis in the lingula.  No acute cardiopulmonary disease otherwise.   Original Report Authenticated By: Hulan Saas, M.D.      EKG: Sinus tachycardia rate of 93. PR interval 156, QRS interval 112, QTc interval 462. Multiple PVCs noted with nonspecific ST and T wave abnormalities. Compared to EKG from 06/01/2012 there are more frequent PVCs.  MDM  Pt with c/o constipation and some crampy abdominal pain.  Tried miralax yesterday but still has not had BM. On arrival, he was noted to have frequent ectopic beats.  EKG similar to previous, and  review of charts shows that he has hx of frequent PVCs.  Vitals as noted are not consistent with exam as the monitor is not picking up pulses.  Pulse check on exam shows HR in 60s-90s.  BPs ok.  Pt has some intermittent, mild tachypnea, but this is usually when he is talking or laughing.  He appears comfortable on multiple rechecks.  I do not suspect significant derangement in his VS.  He had a mild leukocytosis, so with the abd pain, CT scan obtained to assess for serious etiology.  This showed no acute findings (some GB wall edema that appeared chronic and labs/exam not c/w cholecystitis).  On final reassessment, pt is noted to be sitting next to bed and laughing.  He had BM during his stay in the ED and states he feels improved.  Will have pt see PCP tomorrow.  He already has cards f/u next week for recheck of the PVCs.  Caretakers express understanding of plan of care and return precautions  I personally performed the services described in this documentation, which was scribed in my presence. The recorded information has been reviewed and is accurate.    Ashby Dawes, MD 01/05/13 1539

## 2013-01-05 NOTE — ED Notes (Signed)
No BM with constipation x 4 days.  Seen PCP yesterday and given miralax with no success.   Denies abd pain.

## 2013-02-04 ENCOUNTER — Ambulatory Visit: Payer: Medicare Other | Admitting: Cardiology

## 2013-02-16 ENCOUNTER — Encounter: Payer: Self-pay | Admitting: Cardiology

## 2013-02-16 ENCOUNTER — Ambulatory Visit (INDEPENDENT_AMBULATORY_CARE_PROVIDER_SITE_OTHER): Payer: Medicare Other | Admitting: Cardiology

## 2013-02-16 VITALS — BP 102/60 | HR 80 | Ht 66.0 in | Wt 211.0 lb

## 2013-02-16 DIAGNOSIS — I493 Ventricular premature depolarization: Secondary | ICD-10-CM | POA: Insufficient documentation

## 2013-02-16 DIAGNOSIS — I4949 Other premature depolarization: Secondary | ICD-10-CM

## 2013-02-16 DIAGNOSIS — Z87898 Personal history of other specified conditions: Secondary | ICD-10-CM | POA: Insufficient documentation

## 2013-02-16 DIAGNOSIS — Z9189 Other specified personal risk factors, not elsewhere classified: Secondary | ICD-10-CM

## 2013-02-16 NOTE — Patient Instructions (Addendum)
Your physician recommends that you schedule a follow-up appointment in: ONE YEAR 

## 2013-02-16 NOTE — Assessment & Plan Note (Signed)
No recurrence. Continue observation.

## 2013-02-16 NOTE — Assessment & Plan Note (Signed)
Not clearly symptomatic. He continues on beta blocker with prior documentation of normal LVEF. No progressive symptoms reported. Recent ECG reviewed.

## 2013-02-16 NOTE — Progress Notes (Signed)
Clinical Summary Alejandro [name] is a 72 y.o.male last seen in August 2013. He denies any episodes of sudden dizziness or syncope since our last evaluation. Medications are reviewed below.  Recent lab work in July showed potassium 3.3, BUN 11, creatinine 1.0, normal AST and ALT, troponin I 0.01, hemoglobin 13.9, platelets 191. ECG from this July showed sinus rhythm with frequent PVCs, left atrial enlargement, nonspecific ST-T changes.  No Known Allergies  Current Outpatient Prescriptions  Medication Sig Dispense Refill  . acetaminophen (TYLENOL) 325 MG tablet Take 650 mg by mouth every 6 (six) hours as needed. Pain.      Marland Kitchen albuterol (PROAIR HFA) 108 (90 BASE) MCG/ACT inhaler Inhale 2 puffs into the lungs every 4 (four) hours as needed. FOR SHORTNESS OF BREATH      . amLODipine (NORVASC) 5 MG tablet Take 5 mg by mouth daily.        . Ascorbic Acid (VITAMIN C) 1000 MG tablet Take 1,000 mg by mouth daily.      Marland Kitchen aspirin (ASPIR-LOW) 81 MG EC tablet Take 81 mg by mouth daily.        Marland Kitchen dutasteride (AVODART) 0.5 MG capsule Take 0.5 mg by mouth daily.        Marland Kitchen econazole nitrate 1 % cream Apply 1 application topically 2 (two) times daily.       . fish oil-omega-3 fatty acids 1000 MG capsule Take 1 g by mouth 2 (two) times daily.      . fluticasone (FLONASE) 50 MCG/ACT nasal spray Place 1 spray into the nose daily.       Marland Kitchen lisinopril-hydrochlorothiazide (PRINZIDE,ZESTORETIC) 20-12.5 MG per tablet Take 1 tablet by mouth daily.       Marland Kitchen lovastatin (MEVACOR) 40 MG tablet Take 40 mg by mouth at bedtime.       . meloxicam (MOBIC) 15 MG tablet Take 15 mg by mouth daily.      . metFORMIN (GLUCOPHAGE) 500 MG tablet Take 500 mg by mouth 2 (two) times daily with a meal.       . metoprolol succinate (TOPROL XL) 25 MG 24 hr tablet Take 25 mg by mouth daily.        . Multiple Vitamins-Minerals (MULTIVITAMIN WITH MINERALS) tablet Take 1 tablet by mouth daily.      Marland Kitchen omeprazole (PRILOSEC) 20 MG capsule Take 20 mg by  mouth every morning. 1 po every morning      . polyethylene glycol (MIRALAX / GLYCOLAX) packet Take 17 g by mouth See admin instructions. Mix 1 capful (17 gm) in 8 oz of water twice daily until regular bowel movements occur, then start once daily as needed for constipation.      . risperiDONE (RISPERDAL) 3 MG tablet Take 3 mg by mouth 2 (two) times daily.       . Tamsulosin HCl (FLOMAX) 0.4 MG CAPS Take 0.4 mg by mouth daily.       . traZODone (DESYREL) 150 MG tablet Take 150 mg by mouth at bedtime.        No current facility-administered medications for this visit.    Past Medical History  Diagnosis Date  . Mixed hyperlipidemia   . Essential hypertension, benign   . Schizophrenia   . Positive PPD     Treated  . DM2 (diabetes mellitus, type 2)   . Diverticulosis     Pancolonic   . Hemorrhoids   . PVC's (premature ventricular contractions)   . Syncope   . Mentally  challenged   . History of GI diverticular bleed     Social History Alejandro [name] reports that he has been smoking Cigarettes.  He has a 25 pack-year smoking history. He does not have any smokeless tobacco history on file. Alejandro [name] reports that he does not drink alcohol.  Review of Systems Reports no chest pain. Stable appetite. No falls. Intermittent constipation. Otherwise negative.  Physical Examination Filed Vitals:   02/16/13 1420  BP: 102/60  Pulse: 80   Filed Weights   02/16/13 1420  Weight: 211 lb (95.709 kg)    Obese male in no acute distress.  HEENT: Conjunctiva and lids normal, oropharynx with poor dentition.  Neck: Supple, elevated JVP or carotid bruits.  Lungs: Clear to auscultation, nonlabored.  Cardiac: Regular rate and rhythm with ectopic beats, soft S4, no S3, no pericardial rub.  Abdomen: Protuberant, obese, unable to easily palpate liver edge, bowel sounds present, no tenderness.  Extremities: No pitting edema, distal pulses 1+.    Problem List and Plan   History of syncope No  recurrence. Continue observation.  PVC's (premature ventricular contractions) Not clearly symptomatic. He continues on beta blocker with prior documentation of normal LVEF. No progressive symptoms reported. Recent ECG reviewed.    Jonelle Sidle, M.D., F.A.C.C.

## 2013-03-08 ENCOUNTER — Encounter: Payer: Self-pay | Admitting: Gastroenterology

## 2013-04-22 ENCOUNTER — Ambulatory Visit (INDEPENDENT_AMBULATORY_CARE_PROVIDER_SITE_OTHER): Payer: Medicare Other | Admitting: Gastroenterology

## 2013-04-22 ENCOUNTER — Encounter: Payer: Self-pay | Admitting: Gastroenterology

## 2013-04-22 ENCOUNTER — Encounter (INDEPENDENT_AMBULATORY_CARE_PROVIDER_SITE_OTHER): Payer: Self-pay

## 2013-04-22 VITALS — BP 110/80 | HR 76 | Temp 98.4°F | Wt 210.0 lb

## 2013-04-22 DIAGNOSIS — R1310 Dysphagia, unspecified: Secondary | ICD-10-CM

## 2013-04-22 NOTE — Progress Notes (Signed)
Referring Provider: Biagio Quint* Primary Care Physician:  Lucius Conn Primary GI: Dr. Darrick Penna   Chief Complaint  Patient presents with  . Follow-up    HPI:   Alejandro [name] presents today in routine follow-up with history of GERD and dysphagia. Last seen in April 2014. EGD on file from Nov 2013 with chronic gastritis, Savary dilation. BPE ordered due to question of Zenker's diverticulum. Age-related dysmotility appreciated. Down 6 lbs from last visit, purposefully. Sometimes stomach is boiling, making noises. Denies abdominal pain, N/V. Denies dysphagia or refractory GERD. Continues Prilosec each morning.   Past Medical History  Diagnosis Date  . Mixed hyperlipidemia   . Essential hypertension, benign   . Schizophrenia   . Positive PPD     Treated  . DM2 (diabetes mellitus, type 2)   . Diverticulosis     Pancolonic   . Hemorrhoids   . PVC's (premature ventricular contractions)   . Syncope   . Mentally challenged   . History of GI diverticular bleed     Past Surgical History  Procedure Laterality Date  . Colonoscopy   07/11/2004     Rehman-Pancolonic diverticulosis/Small external hemorrhoids/ The colon was full of coffee-ground material, but no active bleeding  . Esophagogastroduodenoscopy  07/11/2004    Rehman-incomplete ring GEJ, Small sliding hiatal hernia/Bulbar duodenitis without stigmata of bleeding  . Givens capsule study  01/10/2005    Rehman-Few petechiae noted at involving gastric mucosa as well as duodenum and  jejunum/ the  surface of the small bowel mucosa could not be seen because of food   debris limiting the quality of this study.  . Colonoscopy  03/29/2008    Large mouth ascending colon and sigmoid colon diverticula which were frequent.  Otherwise, no polyps, masses, inflammatory changes or AVM seen. /Small internal hemorrhoids, otherwise normal retroflexed view of the rectum  . Arm surgery      left  . Esophagogastroduodenoscopy  (egd) with esophageal dilation  04/17/2012    ZOX:WRUEAVWU ZENKER'S DIVERTICULUM OR PRIMARY ESOPHGEAL MOILITY DISORDER/MODERATE Erosive gastritis/MILD Duodenal inflammation     Current Outpatient Prescriptions  Medication Sig Dispense Refill  . albuterol (PROAIR HFA) 108 (90 BASE) MCG/ACT inhaler Inhale 2 puffs into the lungs every 4 (four) hours as needed. FOR SHORTNESS OF BREATH      . amLODipine (NORVASC) 5 MG tablet Take 5 mg by mouth daily.        . Ascorbic Acid (VITAMIN C) 1000 MG tablet Take 1,000 mg by mouth daily.      Marland Kitchen aspirin (ASPIR-LOW) 81 MG EC tablet Take 81 mg by mouth daily.        Marland Kitchen dutasteride (AVODART) 0.5 MG capsule Take 0.5 mg by mouth daily.        Marland Kitchen econazole nitrate 1 % cream Apply 1 application topically 2 (two) times daily.       . fish oil-omega-3 fatty acids 1000 MG capsule Take 1 g by mouth 2 (two) times daily.      . fluticasone (FLONASE) 50 MCG/ACT nasal spray Place 1 spray into the nose daily.       Marland Kitchen lisinopril-hydrochlorothiazide (PRINZIDE,ZESTORETIC) 20-12.5 MG per tablet Take 1 tablet by mouth daily.       Marland Kitchen lovastatin (MEVACOR) 40 MG tablet Take 40 mg by mouth at bedtime.       . metFORMIN (GLUCOPHAGE) 500 MG tablet Take 500 mg by mouth 2 (two) times daily with a meal.       .  metoprolol succinate (TOPROL XL) 25 MG 24 hr tablet Take 25 mg by mouth daily.        . Multiple Vitamins-Minerals (MULTIVITAMIN WITH MINERALS) tablet Take 1 tablet by mouth daily.      Marland Kitchen omeprazole (PRILOSEC) 20 MG capsule Take 20 mg by mouth every morning. 1 po every morning      . polyethylene glycol (MIRALAX / GLYCOLAX) packet Take 17 g by mouth See admin instructions. Mix 1 capful (17 gm) in 8 oz of water twice daily until regular bowel movements occur, then start once daily as needed for constipation.      . risperiDONE (RISPERDAL) 3 MG tablet Take 3 mg by mouth 2 (two) times daily.       . Tamsulosin HCl (FLOMAX) 0.4 MG CAPS Take 0.4 mg by mouth daily.       . traZODone  (DESYREL) 150 MG tablet Take 150 mg by mouth at bedtime.       Marland Kitchen acetaminophen (TYLENOL) 325 MG tablet Take 650 mg by mouth every 6 (six) hours as needed. Pain.      . meloxicam (MOBIC) 15 MG tablet Take 15 mg by mouth daily.       No current facility-administered medications for this visit.    Allergies as of 04/22/2013  . (No Known Allergies)    Family History  Problem Relation Age of Onset  . Stroke Father   . Stroke Mother   . CAD Mother     History   Social History  . Marital Status: Single    Spouse Name: N/A    Number of Children: N/A  . Years of Education: N/A   Occupational History  . Janitor     Retired   Social History Main Topics  . Smoking status: Current Every Day Smoker -- 0.50 packs/day for 50 years    Types: Cigarettes  . Smokeless tobacco: None  . Alcohol Use: No  . 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 Sexual Activity: None   Other Topics Concern  . None   Social History Narrative   L[facility] at [facility]. RetiredTeacher, music.     Review of Systems: As mentioned in HPI.   Physical Exam: BP 110/80  Pulse 76  Temp(Src) 98.4 F (36.9 C) (Oral)  Wt 210 lb (95.255 kg) General:   Alert and oriented. No distress noted. Pleasant and cooperative.  Head:  Normocephalic and atraumatic. Eyes:  Conjuctiva clear without scleral icterus. Heart:  S1, S2 present without murmurs, rubs, or gallops. Regular rate and rhythm. Abdomen:  +BS, soft, non-tender and non-distended. No rebound or guarding. No appreciable HSM; difficult to assess due to large AP diameter.  Msk:  Symmetrical without gross deformities. Normal posture. Extremities:  Without edema. Neurologic:  Alert and  oriented x4;  grossly normal neurologically. Skin:  Intact without significant lesions or rashes. Psych:  Alert and cooperative. Normal mood and affect.

## 2013-04-22 NOTE — Patient Instructions (Signed)
Continue to take Prilosec each morning 30 minutes before breakfast.   Great job on the weight loss!!!  We will see you back in 6 months.

## 2013-04-22 NOTE — Progress Notes (Signed)
cc'd to pcp 

## 2013-04-22 NOTE — Assessment & Plan Note (Signed)
At baseline, no concerning signs or symptoms. Continue Prilosec each day and continue weight loss efforts. Applauded on weight loss thus far. Return in 6 months.

## 2013-09-14 ENCOUNTER — Encounter: Payer: Self-pay | Admitting: Gastroenterology

## 2013-10-20 ENCOUNTER — Encounter (INDEPENDENT_AMBULATORY_CARE_PROVIDER_SITE_OTHER): Payer: Self-pay

## 2013-10-20 ENCOUNTER — Encounter: Payer: Self-pay | Admitting: Gastroenterology

## 2013-10-20 ENCOUNTER — Ambulatory Visit (INDEPENDENT_AMBULATORY_CARE_PROVIDER_SITE_OTHER): Payer: Medicare Other | Admitting: Gastroenterology

## 2013-10-20 VITALS — BP 123/72 | HR 76 | Temp 97.4°F | Ht 67.0 in | Wt 204.6 lb

## 2013-10-20 DIAGNOSIS — K760 Fatty (change of) liver, not elsewhere classified: Secondary | ICD-10-CM

## 2013-10-20 DIAGNOSIS — K5900 Constipation, unspecified: Secondary | ICD-10-CM

## 2013-10-20 DIAGNOSIS — R1310 Dysphagia, unspecified: Secondary | ICD-10-CM

## 2013-10-20 DIAGNOSIS — K7689 Other specified diseases of liver: Secondary | ICD-10-CM

## 2013-10-20 NOTE — Patient Instructions (Addendum)
YOU ARE DOING A FANTASTIC JOB WITH LOSING WEIGHT.  FOLLOW A HIGH FIBER DIET.   USE MIRALAX ONCE DAILY.  SEE UROLOGY FOR YOUR URINARY PROBLEMS.  FOLLOW UP IN 6 MOS.

## 2013-10-20 NOTE — Progress Notes (Signed)
Subjective:   Alejandro Curb, PA-C   Patient ID: Alejandro [name], male    DOB: 1940/08/27, 72 y.o.   MRN: [id number]  HPI  LAST SEEN NOV 2014 FOR DYSPHAGIA: 210 LBS. SWALLOWING PRETTY GOOD. MAY GET TIGHT SOMETIME. SAW PCP FOR UTI AND CAN HAVE TROUBLE MOVING BOWLES. RARE THAT STOMACH HURTS. PT DENIES FEVER, CHILLS, BRBPR, nausea, vomiting, melena, diarrhea, problems swallowing, OR heartburn or indigestion.   Past Medical History  Diagnosis Date  . Mixed hyperlipidemia   . Essential hypertension, benign   . Schizophrenia   . Positive PPD     Treated  . DM2 (diabetes mellitus, type 2)   . Diverticulosis     Pancolonic   . Hemorrhoids   . PVC's (premature ventricular contractions)   . Syncope   . Mentally challenged   . History of GI diverticular bleed     Past Surgical History  Procedure Laterality Date  . Colonoscopy   07/11/2004     Rehman-Pancolonic diverticulosis/Small external hemorrhoids/ The colon was full of coffee-ground material, but no active bleeding  . Esophagogastroduodenoscopy  07/11/2004    Rehman-incomplete ring GEJ, Small sliding hiatal hernia/Bulbar duodenitis without stigmata of bleeding  . Givens capsule study  01/10/2005    Rehman-Few petechiae noted at involving gastric mucosa as well as duodenum and  jejunum/ the  surface of the small bowel mucosa could not be seen because of food   debris limiting the quality of this study.  . Colonoscopy  03/29/2008    Large mouth ascending colon and sigmoid colon diverticula which were frequent.  Otherwise, no polyps, masses, inflammatory changes or AVM seen. /Small internal hemorrhoids, otherwise normal retroflexed view of the rectum  . Arm surgery      left  . Esophagogastroduodenoscopy (egd) with esophageal dilation  04/17/2012    GXQ:JJHERDEY ZENKER'S DIVERTICULUM OR PRIMARY ESOPHGEAL MOILITY DISORDER/MODERATE Erosive gastritis/MILD Duodenal inflammation    No Known Allergies  Current Outpatient  Prescriptions  Medication Sig Dispense Refill  . acetaminophen (TYLENOL) 325 MG tablet Take 650 mg by mouth every 6 (six) hours as needed. Pain.      Marland Kitchen albuterol (PROAIR HFA) 108 (90 BASE) MCG/ACT inhaler Inhale 2 puffs into the lungs every 4 (four) hours as needed. FOR SHORTNESS OF BREATH      . amLODipine (NORVASC) 5 MG tablet Take 5 mg by mouth daily.        . Ascorbic Acid (VITAMIN C) 1000 MG tablet Take 1,000 mg by mouth daily.      Marland Kitchen aspirin (ASPIR-LOW) 81 MG EC tablet Take 81 mg by mouth daily.        Marland Kitchen dutasteride (AVODART) 0.5 MG capsule Take 0.5 mg by mouth daily.        Marland Kitchen econazole nitrate 1 % cream Apply 1 application topically 2 (two) times daily.       . fish oil-omega-3 fatty acids 1000 MG capsule Take 1 g by mouth 2 (two) times daily.      . fluticasone (FLONASE) 50 MCG/ACT nasal spray Place 1 spray into the nose daily.       Marland Kitchen lisinopril-hydrochlorothiazide (PRINZIDE,ZESTORETIC) 20-12.5 MG per tablet Take 1 tablet by mouth daily.       Marland Kitchen lovastatin (MEVACOR) 40 MG tablet Take 40 mg by mouth at bedtime.       . meloxicam (MOBIC) 15 MG tablet Take 15 mg by mouth daily.      . metFORMIN (GLUCOPHAGE) 500 MG tablet Take 500 mg  by mouth 2 (two) times daily with a meal.       . metoprolol succinate (TOPROL XL) 25 MG 24 hr tablet Take 25 mg by mouth daily.        . Multiple Vitamins-Minerals (MULTIVITAMIN WITH MINERALS) tablet Take 1 tablet by mouth daily.      Marland Kitchen omeprazole (PRILOSEC) 20 MG capsule Take 20 mg by mouth every morning. 1 po every morning      . polyethylene glycol (MIRALAX / GLYCOLAX) packet Take 17 g by mouth See admin instructions. Mix 1 capful (17 gm) in 8 oz of water twice daily until regular bowel movements occur, then start once daily as needed for constipation.      . risperiDONE (RISPERDAL) 3 MG tablet Take 3 mg by mouth 2 (two) times daily.       . Tamsulosin HCl (FLOMAX) 0.4 MG CAPS Take 0.4 mg by mouth daily.       . traZODone (DESYREL) 150 MG tablet Take 150 mg  by mouth at bedtime.              Review of Systems     Objective:   Physical Exam  Vitals reviewed. Constitutional: He is oriented to person, place, and time. He appears well-nourished. No distress.  HENT:  Head: Normocephalic and atraumatic.  Mouth/Throat: Oropharynx is clear and moist. No oropharyngeal exudate.  Eyes: Pupils are equal, round, and reactive to light. No scleral icterus.  Neck: Normal range of motion. Neck supple.  Cardiovascular: Normal rate, regular rhythm and normal heart sounds.   Pulmonary/Chest: Effort normal and breath sounds normal. No respiratory distress.  Abdominal: Soft. Bowel sounds are normal. He exhibits no distension. There is no tenderness.  Musculoskeletal: He exhibits no edema.  Lymphadenopathy:    He has no cervical adenopathy.  Neurological: He is alert and oriented to person, place, and time.  NO  NEW FOCAL DEFICITS   Psychiatric:  FLAT AFFECT, NL MOOD        Assessment & Plan:

## 2013-10-20 NOTE — Assessment & Plan Note (Signed)
SX CONTROLLED.  CONTINUE TO MONITOR SYMPTOMS. OPV IN 6 MOS

## 2013-10-20 NOTE — Progress Notes (Signed)
cc'd to pcp 

## 2013-10-20 NOTE — Assessment & Plan Note (Signed)
LIKELY DUE TO MEDS/DIABETES.   CONTINUE YOUR WEIGHT LOSS EFFORTS. ADD MIRALAX DAILY. ONE ADDITIONAL DOSE DAILY IF NEEDED. HIGH FIBER DIET OPV IN 6 MOS

## 2013-10-20 NOTE — Assessment & Plan Note (Signed)
CONTINUES WITH INTENTIONAL WEIGHT LOSS.  ENCOURAGED PT TO KEEP LOSING WEIGHT. FOLLOW UP IN 6 MOS.

## 2013-10-20 NOTE — Progress Notes (Signed)
REVIEWED.  

## 2013-10-21 NOTE — Progress Notes (Signed)
Reminder in epic °

## 2013-10-28 ENCOUNTER — Other Ambulatory Visit (HOSPITAL_COMMUNITY): Payer: Self-pay | Admitting: Urology

## 2013-10-28 DIAGNOSIS — N400 Enlarged prostate without lower urinary tract symptoms: Secondary | ICD-10-CM

## 2013-11-02 ENCOUNTER — Ambulatory Visit (HOSPITAL_COMMUNITY)
Admission: RE | Admit: 2013-11-02 | Discharge: 2013-11-02 | Disposition: A | Discharge status: Home / Self Care | Payer: Medicare Other | Source: Ambulatory Visit | Attending: Urology | Admitting: Urology

## 2013-11-02 DIAGNOSIS — N400 Enlarged prostate without lower urinary tract symptoms: Secondary | ICD-10-CM | POA: Diagnosis present

## 2013-11-02 DIAGNOSIS — Q619 Cystic kidney disease, unspecified: Secondary | ICD-10-CM | POA: Diagnosis not present

## 2014-03-22 ENCOUNTER — Encounter: Payer: Self-pay | Admitting: Gastroenterology

## 2014-06-15 ENCOUNTER — Ambulatory Visit: Payer: Medicare Other | Admitting: Gastroenterology

## 2014-07-07 ENCOUNTER — Encounter: Payer: Self-pay | Admitting: Gastroenterology

## 2014-07-07 ENCOUNTER — Ambulatory Visit (INDEPENDENT_AMBULATORY_CARE_PROVIDER_SITE_OTHER): Payer: Medicare Other | Admitting: Gastroenterology

## 2014-07-07 VITALS — BP 123/86 | HR 72 | Temp 98.1°F | Ht 66.5 in | Wt 198.8 lb

## 2014-07-07 DIAGNOSIS — K760 Fatty (change of) liver, not elsewhere classified: Secondary | ICD-10-CM

## 2014-07-07 DIAGNOSIS — K5901 Slow transit constipation: Secondary | ICD-10-CM

## 2014-07-07 DIAGNOSIS — R1310 Dysphagia, unspecified: Secondary | ICD-10-CM

## 2014-07-07 LAB — HEPATIC FUNCTION PANEL
ALBUMIN: 4.1 g/dL (ref 3.5–5.2)
ALK PHOS: 43 U/L (ref 39–117)
ALT: 10 U/L (ref 0–53)
AST: 12 U/L (ref 0–37)
Bilirubin, Direct: 0.1 mg/dL (ref 0.0–0.3)
Indirect Bilirubin: 0.4 mg/dL (ref 0.2–1.2)
TOTAL PROTEIN: 7.6 g/dL (ref 6.0–8.3)
Total Bilirubin: 0.5 mg/dL (ref 0.2–1.2)

## 2014-07-07 NOTE — Patient Instructions (Signed)
CONTINUE YOUR WEIGHT LOSS EFFORTS. LOSE 10 MORE POUNDS.  COMPLETE YOUR LABS.  DRINK WATER TO KEEP YOUR URINE LIGHT YELLOW.  EAT FIBER.  FOLLOW UP IN 6 MOS.

## 2014-07-07 NOTE — Progress Notes (Signed)
Subjective:    Patient ID: Alejandro [name], male    DOB: 23-May-1941, 74 y.o.   MRN: [id number]  Mackey Birchwood  HPI May GET STRANGLED ON LIQUIDS:1-2X/WEEK. INTENTIONAL WEIGHT LOSS. EATING FIBER. BMs: 2-3X/DAY, NL, NO STRAINING. MAY GET SOB IN LEFT CHEST: 1/2 PK A DAY(SINCE AGE 15)  PT DENIES FEVER, CHILLS, HEMATOCHEZIA, nausea, vomiting, melena, diarrhea, CHANGE IN BOWEL IN HABITS, abdominal pain, or heartburn or indigestion.   Past Medical History  Diagnosis Date  . Mixed hyperlipidemia   . Essential hypertension, benign   . Schizophrenia   . Positive PPD     Treated  . DM2 (diabetes mellitus, type 2)   . Diverticulosis     Pancolonic   . Hemorrhoids   . PVC's (premature ventricular contractions)   . Syncope   . Mentally challenged   . History of GI diverticular bleed    Past Surgical History  Procedure Laterality Date  . Colonoscopy   07/11/2004     Rehman-Pancolonic diverticulosis/Small external hemorrhoids/ The colon was full of coffee-ground material, but no active bleeding  . Esophagogastroduodenoscopy  07/11/2004    Rehman-incomplete ring GEJ, Small sliding hiatal hernia/Bulbar duodenitis without stigmata of bleeding  . Givens capsule study  01/10/2005    Rehman-Few petechiae noted at involving gastric mucosa as well as duodenum and  jejunum/ the  surface of the small bowel mucosa could not be seen because of food   debris limiting the quality of this study.  . Colonoscopy  03/29/2008    Large mouth ascending colon and sigmoid colon diverticula which were frequent.  Otherwise, no polyps, masses, inflammatory changes or AVM seen. /Small internal hemorrhoids, otherwise normal retroflexed view of the rectum  . Arm surgery      left  . Esophagogastroduodenoscopy (egd) with esophageal dilation  04/17/2012    IRJ:JOACZYSA ZENKER'S DIVERTICULUM OR PRIMARY ESOPHGEAL MOILITY DISORDER/MODERATE Erosive gastritis/MILD Duodenal inflammation    No Known  Allergies  Current Outpatient Prescriptions  Medication Sig Dispense Refill  . acetaminophen (TYLENOL) 325 MG tablet Take 650 mg by mouth every 6 (six) hours as needed. Pain.    Marland Kitchen amLODipine (NORVASC) 5 MG tablet Take 5 mg by mouth daily.      . Ascorbic Acid (VITAMIN C) 1000 MG tablet Take 1,000 mg by mouth daily.    Marland Kitchen aspirin (ASPIR-LOW) 81 MG EC tablet Take 81 mg by mouth daily.      . Cholecalciferol (VITAMIN D) 2000 UNITS tablet Take 2,000 Units by mouth daily.    Marland Kitchen dutasteride (AVODART) 0.5 MG capsule Take 0.5 mg by mouth daily.      Marland Kitchen econazole nitrate 1 % cream Apply 1 application topically 2 (two) times daily.     . fish oil-omega-3 fatty acids 1000 MG capsule Take 1 g by mouth 2 (two) times daily.    . fluticasone (FLONASE) 50 MCG/ACT nasal spray Place 1 spray into the nose daily.     Marland Kitchen lisinopril-hydrochlorothiazide (PRINZIDE,ZESTORETIC) 20-12.5 MG per tablet Take 1 tablet by mouth daily.     Marland Kitchen lovastatin (MEVACOR) 40 MG tablet Take 40 mg by mouth at bedtime.     . meloxicam (MOBIC) 15 MG tablet Take 15 mg by mouth daily.    . metFORMIN (GLUCOPHAGE) 500 MG tablet Take 500 mg by mouth 2 (two) times daily with a meal.     . metoprolol succinate (TOPROL XL) 25 MG 24 hr tablet Take 25 mg by mouth daily.      Marland Kitchen  Multiple Vitamins-Minerals (MULTIVITAMIN WITH MINERALS) tablet Take 1 tablet by mouth daily.    Marland Kitchen omeprazole (PRILOSEC) 20 MG capsule Take 20 mg by mouth every morning. 1 po every morning    . polyethylene glycol (MIRALAX / GLYCOLAX) packet Take 17 g by mouth See admin instructions. Mix 1 capful (17 gm) in 8 oz of water twice daily until regular bowel movements occur, then start once daily as needed for constipation.    . risperiDONE (RISPERDAL) 3 MG tablet Take 3 mg by mouth 2 (two) times daily.     . Tamsulosin HCl (FLOMAX) 0.4 MG CAPS Take 0.4 mg by mouth daily.     . traZODone (DESYREL) 150 MG tablet Take 150 mg by mouth at bedtime.     Marland Kitchen albuterol (PROAIR HFA) 108 (90 BASE)  MCG/ACT inhaler Inhale 2 puffs into the lungs every 4 (four) hours as needed. FOR SHORTNESS OF BREATH     Review of Systems     Objective:   Physical Exam  Constitutional: He is oriented to person, place, and time. He appears well-developed and well-nourished. No distress.  HENT:  Head: Normocephalic and atraumatic.  Mouth/Throat: Oropharynx is clear and moist. No oropharyngeal exudate.  Eyes: Pupils are equal, round, and reactive to light. No scleral icterus.  Neck: Normal range of motion. Neck supple.  Cardiovascular: Normal rate, regular rhythm and normal heart sounds.   Pulmonary/Chest: Effort normal and breath sounds normal. No respiratory distress.  Abdominal: Soft. Bowel sounds are normal. He exhibits no distension. There is no tenderness.  Musculoskeletal: He exhibits no edema.  Lymphadenopathy:    He has no cervical adenopathy.  Neurological: He is alert and oriented to person, place, and time.  NO  NEW FOCAL DEFICITS   Psychiatric: He has a normal mood and affect.  Vitals reviewed.         Assessment & Plan:

## 2014-07-07 NOTE — Assessment & Plan Note (Signed)
Intentional weight loss.  CONTINUE YOUR WEIGHT LOSS EFFORTS. HFP today FOLLOW UP IN 6 MOS.

## 2014-07-07 NOTE — Progress Notes (Signed)
ON RECALL LIST  °

## 2014-07-07 NOTE — Progress Notes (Signed)
cc'ed to pcp °

## 2014-07-07 NOTE — Assessment & Plan Note (Signed)
SX CONTROLLED. WOULD LIKE TO D/C DAILY MIRALAX.  D/C VITAMIN C & CHANGE MIRALAX TO PRN DRINK WATER EAT FIBER FOLLOW UP IN 6 MOS.

## 2014-07-07 NOTE — Assessment & Plan Note (Signed)
Occasionally gets choke don liquids, but not solids.  CONTINUE TO MONITOR SYMPTOMS. FOLLOW UP IN 6 MOS.

## 2014-07-07 NOTE — Addendum Note (Signed)
Addended by: Claudina Lick on: 07/07/2014 11:48 AM   Modules accepted: Orders

## 2014-07-14 NOTE — Progress Notes (Signed)
PLEASE CALL PT. HIS LIVER TESTS ARE NORMAL.

## 2014-07-15 NOTE — Progress Notes (Signed)
Called pt's facility, [facility] and spoke to the caregiver, [name] and informed her of the results.

## 2014-09-06 ENCOUNTER — Encounter: Payer: Self-pay | Admitting: Gastroenterology

## 2014-09-07 ENCOUNTER — Other Ambulatory Visit (HOSPITAL_COMMUNITY): Payer: Self-pay | Admitting: Physician Assistant

## 2014-09-07 DIAGNOSIS — R9389 Abnormal findings on diagnostic imaging of other specified body structures: Secondary | ICD-10-CM

## 2014-09-09 ENCOUNTER — Ambulatory Visit (HOSPITAL_COMMUNITY)
Admission: RE | Admit: 2014-09-09 | Discharge: 2014-09-09 | Disposition: A | Discharge status: Home / Self Care | Payer: Medicare Other | Source: Ambulatory Visit | Attending: Physician Assistant | Admitting: Physician Assistant

## 2014-09-09 DIAGNOSIS — R9389 Abnormal findings on diagnostic imaging of other specified body structures: Secondary | ICD-10-CM

## 2014-09-09 DIAGNOSIS — R938 Abnormal findings on diagnostic imaging of other specified body structures: Secondary | ICD-10-CM | POA: Insufficient documentation

## 2014-09-09 DIAGNOSIS — R0989 Other specified symptoms and signs involving the circulatory and respiratory systems: Secondary | ICD-10-CM | POA: Insufficient documentation

## 2014-09-09 MED ORDER — IOHEXOL 300 MG/ML  SOLN
90.0000 mL | Freq: Once | INTRAMUSCULAR | Status: AC | PRN
  Administered 2014-09-09: 100 mL via INTRAVENOUS

## 2014-09-30 ENCOUNTER — Inpatient Hospital Stay (HOSPITAL_COMMUNITY)
Admission: EM | Admit: 2014-09-30 | Discharge: 2014-10-02 | DRG: 204 | Disposition: A | Discharge status: Home / Self Care | Payer: Medicare Other | Attending: Internal Medicine | Admitting: Internal Medicine

## 2014-09-30 ENCOUNTER — Emergency Department (HOSPITAL_COMMUNITY): Payer: Medicare Other

## 2014-09-30 ENCOUNTER — Encounter (HOSPITAL_COMMUNITY): Payer: Self-pay | Admitting: Emergency Medicine

## 2014-09-30 DIAGNOSIS — F79 Unspecified intellectual disabilities: Secondary | ICD-10-CM | POA: Diagnosis present

## 2014-09-30 DIAGNOSIS — E041 Nontoxic single thyroid nodule: Secondary | ICD-10-CM | POA: Diagnosis present

## 2014-09-30 DIAGNOSIS — Z8249 Family history of ischemic heart disease and other diseases of the circulatory system: Secondary | ICD-10-CM | POA: Diagnosis not present

## 2014-09-30 DIAGNOSIS — I10 Essential (primary) hypertension: Secondary | ICD-10-CM | POA: Diagnosis present

## 2014-09-30 DIAGNOSIS — R918 Other nonspecific abnormal finding of lung field: Secondary | ICD-10-CM | POA: Diagnosis present

## 2014-09-30 DIAGNOSIS — Z7951 Long term (current) use of inhaled steroids: Secondary | ICD-10-CM

## 2014-09-30 DIAGNOSIS — F17210 Nicotine dependence, cigarettes, uncomplicated: Secondary | ICD-10-CM | POA: Diagnosis present

## 2014-09-30 DIAGNOSIS — D649 Anemia, unspecified: Secondary | ICD-10-CM | POA: Diagnosis present

## 2014-09-30 DIAGNOSIS — R0789 Other chest pain: Secondary | ICD-10-CM

## 2014-09-30 DIAGNOSIS — F209 Schizophrenia, unspecified: Secondary | ICD-10-CM | POA: Diagnosis present

## 2014-09-30 DIAGNOSIS — I493 Ventricular premature depolarization: Secondary | ICD-10-CM | POA: Diagnosis present

## 2014-09-30 DIAGNOSIS — R079 Chest pain, unspecified: Secondary | ICD-10-CM | POA: Diagnosis present

## 2014-09-30 DIAGNOSIS — I5040 Unspecified combined systolic (congestive) and diastolic (congestive) heart failure: Secondary | ICD-10-CM | POA: Diagnosis present

## 2014-09-30 DIAGNOSIS — Z7982 Long term (current) use of aspirin: Secondary | ICD-10-CM

## 2014-09-30 DIAGNOSIS — Z823 Family history of stroke: Secondary | ICD-10-CM | POA: Diagnosis not present

## 2014-09-30 DIAGNOSIS — C3490 Malignant neoplasm of unspecified part of unspecified bronchus or lung: Secondary | ICD-10-CM

## 2014-09-30 DIAGNOSIS — E782 Mixed hyperlipidemia: Secondary | ICD-10-CM | POA: Diagnosis present

## 2014-09-30 DIAGNOSIS — E119 Type 2 diabetes mellitus without complications: Secondary | ICD-10-CM | POA: Diagnosis present

## 2014-09-30 LAB — COMPREHENSIVE METABOLIC PANEL
ALT: 12 U/L (ref 0–53)
AST: 16 U/L (ref 0–37)
Albumin: 3.2 g/dL — ABNORMAL LOW (ref 3.5–5.2)
Alkaline Phosphatase: 43 U/L (ref 39–117)
Anion gap: 10 (ref 5–15)
BILIRUBIN TOTAL: 0.8 mg/dL (ref 0.3–1.2)
BUN: 15 mg/dL (ref 6–23)
CO2: 28 mmol/L (ref 19–32)
Calcium: 9 mg/dL (ref 8.4–10.5)
Chloride: 100 mmol/L (ref 96–112)
Creatinine, Ser: 0.91 mg/dL (ref 0.50–1.35)
GFR calc Af Amer: >90 mL/min (ref >90)
GFR calc non Af Amer: 81 mL/min — ABNORMAL LOW (ref >90)
Glucose, Bld: 164 mg/dL — ABNORMAL HIGH (ref 70–99)
Potassium: 3.4 mmol/L — ABNORMAL LOW (ref 3.5–5.1)
SODIUM: 138 mmol/L (ref 135–145)
TOTAL PROTEIN: 7.2 g/dL (ref 6.0–8.3)

## 2014-09-30 LAB — CBC WITH DIFFERENTIAL/PLATELET
Basophils Absolute: 0 10*3/uL (ref 0.0–0.1)
Basophils Relative: 0 % (ref 0–1)
Eosinophils Absolute: 0.2 10*3/uL (ref 0.0–0.7)
Eosinophils Relative: 2 % (ref 0–5)
HCT: 35 % — ABNORMAL LOW (ref 39.0–52.0)
Hemoglobin: 11.2 g/dL — ABNORMAL LOW (ref 13.0–17.0)
LYMPHS ABS: 1.7 10*3/uL (ref 0.7–4.0)
LYMPHS PCT: 17 % (ref 12–46)
MCH: 27.5 pg (ref 26.0–34.0)
MCHC: 32 g/dL (ref 30.0–36.0)
MCV: 85.8 fL (ref 78.0–100.0)
Monocytes Absolute: 1 10*3/uL (ref 0.1–1.0)
Monocytes Relative: 10 % (ref 3–12)
NEUTROS ABS: 6.8 10*3/uL (ref 1.7–7.7)
NEUTROS PCT: 71 % (ref 43–77)
PLATELETS: 255 10*3/uL (ref 150–400)
RBC: 4.08 MIL/uL — AB (ref 4.22–5.81)
RDW: 13.5 % (ref 11.5–15.5)
WBC: 9.6 10*3/uL (ref 4.0–10.5)

## 2014-09-30 LAB — D-DIMER, QUANTITATIVE (NOT AT ARMC): D DIMER QUANT: 1.25 ug{FEU}/mL — AB (ref 0.00–0.48)

## 2014-09-30 LAB — TROPONIN I: Troponin I: <0.03 ng/mL (ref <0.031)

## 2014-09-30 LAB — LIPASE, BLOOD: Lipase: 22 U/L (ref 11–59)

## 2014-09-30 LAB — BRAIN NATRIURETIC PEPTIDE: B Natriuretic Peptide: 36 pg/mL (ref 0.0–100.0)

## 2014-09-30 MED ORDER — AMLODIPINE BESYLATE 5 MG PO TABS
5.0000 mg | ORAL_TABLET | Freq: Every day | ORAL | Status: DC
  Administered 2014-10-01 – 2014-10-02 (×2): 5 mg via ORAL
  Filled 2014-09-30 (×2): qty 1

## 2014-09-30 MED ORDER — LISINOPRIL-HYDROCHLOROTHIAZIDE 20-12.5 MG PO TABS: 1.0000 | ORAL_TABLET | Freq: Every day | ORAL | Status: DC

## 2014-09-30 MED ORDER — IOHEXOL 350 MG/ML SOLN
100.0000 mL | Freq: Once | INTRAVENOUS | Status: AC | PRN
  Administered 2014-09-30: 100 mL via INTRAVENOUS

## 2014-09-30 MED ORDER — DUTASTERIDE 0.5 MG PO CAPS
0.5000 mg | ORAL_CAPSULE | Freq: Every day | ORAL | Status: DC
  Administered 2014-10-01 – 2014-10-02 (×2): 0.5 mg via ORAL
  Filled 2014-09-30 (×3): qty 1

## 2014-09-30 MED ORDER — OMEGA-3-ACID ETHYL ESTERS 1 G PO CAPS
1.0000 g | ORAL_CAPSULE | Freq: Two times a day (BID) | ORAL | Status: DC
  Administered 2014-10-01 – 2014-10-02 (×3): 1 g via ORAL
  Filled 2014-09-30 (×4): qty 1

## 2014-09-30 MED ORDER — ASPIRIN EC 325 MG PO TBEC
325.0000 mg | DELAYED_RELEASE_TABLET | Freq: Every day | ORAL | Status: DC
  Administered 2014-10-01 – 2014-10-02 (×2): 325 mg via ORAL
  Filled 2014-09-30 (×2): qty 1

## 2014-09-30 MED ORDER — SODIUM CHLORIDE 0.9 % IJ SOLN
3.0000 mL | Freq: Two times a day (BID) | INTRAMUSCULAR | Status: DC
  Administered 2014-09-30 – 2014-10-02 (×4): 3 mL via INTRAVENOUS

## 2014-09-30 MED ORDER — PANTOPRAZOLE SODIUM 40 MG PO TBEC
40.0000 mg | DELAYED_RELEASE_TABLET | Freq: Every day | ORAL | Status: DC
  Administered 2014-10-01 – 2014-10-02 (×2): 40 mg via ORAL
  Filled 2014-09-30 (×2): qty 1

## 2014-09-30 MED ORDER — PRAVASTATIN SODIUM 40 MG PO TABS
40.0000 mg | ORAL_TABLET | Freq: Every day | ORAL | Status: DC
  Administered 2014-10-01: 40 mg via ORAL
  Filled 2014-09-30: qty 1

## 2014-09-30 MED ORDER — ENOXAPARIN SODIUM 40 MG/0.4ML ~~LOC~~ SOLN
40.0000 mg | SUBCUTANEOUS | Status: DC
  Administered 2014-10-01 – 2014-10-02 (×2): 40 mg via SUBCUTANEOUS
  Filled 2014-09-30 (×2): qty 0.4

## 2014-09-30 MED ORDER — INSULIN ASPART 100 UNIT/ML ~~LOC~~ SOLN: 0.0000 [IU] | Freq: Three times a day (TID) | SUBCUTANEOUS | Status: DC

## 2014-09-30 MED ORDER — IPRATROPIUM-ALBUTEROL 0.5-2.5 (3) MG/3ML IN SOLN
3.0000 mL | Freq: Once | RESPIRATORY_TRACT | Status: AC
  Administered 2014-09-30: 3 mL via RESPIRATORY_TRACT
  Filled 2014-09-30: qty 3

## 2014-09-30 MED ORDER — TAMSULOSIN HCL 0.4 MG PO CAPS
0.4000 mg | ORAL_CAPSULE | Freq: Every day | ORAL | Status: DC
  Administered 2014-10-01 – 2014-10-02 (×2): 0.4 mg via ORAL
  Filled 2014-09-30 (×2): qty 1

## 2014-09-30 MED ORDER — METOPROLOL SUCCINATE ER 25 MG PO TB24
25.0000 mg | ORAL_TABLET | Freq: Every day | ORAL | Status: DC
  Filled 2014-09-30 (×2): qty 1

## 2014-09-30 MED ORDER — ONDANSETRON HCL 4 MG/2ML IJ SOLN: 4.0000 mg | Freq: Four times a day (QID) | INTRAMUSCULAR | Status: DC | PRN

## 2014-09-30 MED ORDER — FLUTICASONE PROPIONATE 50 MCG/ACT NA SUSP
1.0000 | Freq: Every day | NASAL | Status: DC
  Administered 2014-10-01 – 2014-10-02 (×2): 1 via NASAL
  Filled 2014-09-30: qty 16

## 2014-09-30 MED ORDER — ONDANSETRON HCL 4 MG PO TABS: 4.0000 mg | ORAL_TABLET | Freq: Four times a day (QID) | ORAL | Status: DC | PRN

## 2014-09-30 MED ORDER — POLYETHYLENE GLYCOL 3350 17 G PO PACK
17.0000 g | PACK | Freq: Every day | ORAL | Status: DC | PRN
  Administered 2014-10-01: 17 g via ORAL
  Filled 2014-09-30: qty 1

## 2014-09-30 MED ORDER — ECONAZOLE NITRATE 1 % EX CREA: 1.0000 "application " | TOPICAL_CREAM | Freq: Two times a day (BID) | CUTANEOUS | Status: DC

## 2014-09-30 MED ORDER — LISINOPRIL 10 MG PO TABS
20.0000 mg | ORAL_TABLET | Freq: Every day | ORAL | Status: DC
  Administered 2014-10-01 – 2014-10-02 (×2): 20 mg via ORAL
  Filled 2014-09-30 (×2): qty 2

## 2014-09-30 MED ORDER — OMEGA-3 FATTY ACIDS 1000 MG PO CAPS: 1.0000 g | ORAL_CAPSULE | Freq: Two times a day (BID) | ORAL | Status: DC

## 2014-09-30 MED ORDER — VITAMIN D 1000 UNITS PO TABS
2000.0000 [IU] | ORAL_TABLET | Freq: Every day | ORAL | Status: DC
  Administered 2014-10-01 – 2014-10-02 (×2): 2000 [IU] via ORAL
  Filled 2014-09-30 (×2): qty 2

## 2014-09-30 MED ORDER — ALBUTEROL SULFATE (2.5 MG/3ML) 0.083% IN NEBU: 3.0000 mL | INHALATION_SOLUTION | RESPIRATORY_TRACT | Status: DC | PRN

## 2014-09-30 MED ORDER — HYDROCHLOROTHIAZIDE 12.5 MG PO CAPS
12.5000 mg | ORAL_CAPSULE | Freq: Every day | ORAL | Status: DC
  Administered 2014-10-01 – 2014-10-02 (×2): 12.5 mg via ORAL
  Filled 2014-09-30 (×2): qty 1

## 2014-09-30 MED ORDER — ADULT MULTIVITAMIN W/MINERALS CH
1.0000 | ORAL_TABLET | Freq: Every day | ORAL | Status: DC
  Administered 2014-10-01 – 2014-10-02 (×2): 1 via ORAL
  Filled 2014-09-30 (×2): qty 1

## 2014-09-30 MED ORDER — ACETAMINOPHEN 650 MG RE SUPP: 650.0000 mg | Freq: Four times a day (QID) | RECTAL | Status: DC | PRN

## 2014-09-30 MED ORDER — ACETAMINOPHEN 325 MG PO TABS: 650.0000 mg | ORAL_TABLET | Freq: Four times a day (QID) | ORAL | Status: DC | PRN

## 2014-09-30 MED ORDER — RISPERIDONE 1 MG PO TABS
3.0000 mg | ORAL_TABLET | Freq: Two times a day (BID) | ORAL | Status: DC
  Administered 2014-09-30 – 2014-10-02 (×4): 3 mg via ORAL
  Filled 2014-09-30 (×4): qty 3

## 2014-09-30 MED ORDER — TRAZODONE HCL 50 MG PO TABS
150.0000 mg | ORAL_TABLET | Freq: Every day | ORAL | Status: DC
  Administered 2014-09-30 – 2014-10-01 (×2): 150 mg via ORAL
  Filled 2014-09-30 (×2): qty 3

## 2014-09-30 NOTE — ED Notes (Signed)
Pt brought in by caregiver from [facility] in [facility] after complaints of R sided chest pain which he describes as "crampy" in nature since early this morning.

## 2014-09-30 NOTE — H&P (Signed)
[facility] History and Physical  TIELER COURNOYER AYT:[id number] DOB: 13-Jan-1941 DOA: 09/30/2014  Referring physician: Dr. Wyvonnia Dusky. PCP: [name], PA-C   Chief Complaint: Chest pain.  HPI: Alejandro [name] is a 74 y.o. male history of diabetes mellitus type 2, hypertension, ongoing tobacco abuse, schizophrenia was brought to the ER from group home after patient had chest pain. Patient had chest pain since last night. Patient chest pain is mostly on the right side on deep inspiration. Stabbing in nature. Patient also has been having some productive cough denies any fever or chills. The ER patient had elevated d-dimer and had undergone CT angiogram of the chest which shows right-sided lung mass which is enlarging. EKG and cardiac markers were unremarkable. Patient has been admitted for further management. Patient denies any shortness of breath palpitations diaphoresis. Denies any abdominal pain nausea vomiting or diarrhea.   Review of Systems: As presented in the history of presenting illness, rest negative.  Past Medical History  Diagnosis Date  . Mixed hyperlipidemia   . Essential hypertension, benign   . Schizophrenia   . Positive PPD     Treated  . DM2 (diabetes mellitus, type 2)   . Diverticulosis     Pancolonic   . Hemorrhoids   . PVC's (premature ventricular contractions)   . Syncope   . Mentally challenged   . History of GI diverticular bleed    Past Surgical History  Procedure Laterality Date  . Colonoscopy   07/11/2004     Rehman-Pancolonic diverticulosis/Small external hemorrhoids/ The colon was full of coffee-ground material, but no active bleeding  . Esophagogastroduodenoscopy  07/11/2004    Rehman-incomplete ring GEJ, Small sliding hiatal hernia/Bulbar duodenitis without stigmata of bleeding  . Givens capsule study  01/10/2005    Rehman-Few petechiae noted at involving gastric mucosa as well as duodenum and  jejunum/ the  surface of the small bowel mucosa  could not be seen because of food   debris limiting the quality of this study.  . Colonoscopy  03/29/2008    Large mouth ascending colon and sigmoid colon diverticula which were frequent.  Otherwise, no polyps, masses, inflammatory changes or AVM seen. /Small internal hemorrhoids, otherwise normal retroflexed view of the rectum  . Arm surgery      left  . Esophagogastroduodenoscopy (egd) with esophageal dilation  04/17/2012    TFT:DDUKGURK ZENKER'S DIVERTICULUM OR PRIMARY ESOPHGEAL MOILITY DISORDER/MODERATE Erosive gastritis/MILD Duodenal inflammation    Social History:  reports that he has been smoking Cigarettes.  He has a 25 pack-year smoking history. He does not have any smokeless tobacco history on file. He reports that he does not drink alcohol or use illicit drugs. Where does patient live group home. Can patient participate in ADLs? Not sure.  No Known Allergies  Family History:  Family History  Problem Relation Age of Onset  . Stroke Father   . Stroke Mother   . CAD Mother       Prior to Admission medications   Medication Sig Start Date End Date Taking? Authorizing Provider  acetaminophen (TYLENOL) 325 MG tablet Take 650 mg by mouth every 6 (six) hours as needed. Pain.   Yes Historical Provider, MD  albuterol (PROAIR HFA) 108 (90 BASE) MCG/ACT inhaler Inhale 2 puffs into the lungs every 4 (four) hours as needed. FOR SHORTNESS OF BREATH   Yes Historical Provider, MD  amLODipine (NORVASC) 5 MG tablet Take 5 mg by mouth daily.     Yes Historical Provider, MD  aspirin (ASPIR-LOW) 81 MG EC tablet Take 81 mg by mouth daily.     Yes Historical Provider, MD  Cholecalciferol (VITAMIN D) 2000 UNITS tablet Take 2,000 Units by mouth daily.   Yes Historical Provider, MD  dutasteride (AVODART) 0.5 MG capsule Take 0.5 mg by mouth daily.     Yes Historical Provider, MD  econazole nitrate 1 % cream Apply 1 application topically 2 (two) times daily.  12/23/11  Yes Historical Provider, MD  fish  oil-omega-3 fatty acids 1000 MG capsule Take 1 g by mouth 2 (two) times daily.   Yes Historical Provider, MD  fluticasone (FLONASE) 50 MCG/ACT nasal spray Place 1 spray into the nose daily.  01/23/12  Yes Historical Provider, MD  lisinopril-hydrochlorothiazide (PRINZIDE,ZESTORETIC) 20-12.5 MG per tablet Take 1 tablet by mouth daily.    Yes Historical Provider, MD  lovastatin (MEVACOR) 40 MG tablet Take 40 mg by mouth at bedtime.    Yes Historical Provider, MD  meloxicam (MOBIC) 15 MG tablet Take 15 mg by mouth daily as needed for pain.    Yes Historical Provider, MD  metFORMIN (GLUCOPHAGE) 500 MG tablet Take 500 mg by mouth 2 (two) times daily with a meal.    Yes Historical Provider, MD  metoprolol succinate (TOPROL XL) 25 MG 24 hr tablet Take 25 mg by mouth daily.     Yes Historical Provider, MD  Multiple Vitamins-Minerals (MULTIVITAMIN WITH MINERALS) tablet Take 1 tablet by mouth daily.   Yes Historical Provider, MD  omeprazole (PRILOSEC) 20 MG capsule Take 20 mg by mouth every morning. 1 po every morning 04/17/12  Yes Danie Binder, MD  polyethylene glycol (MIRALAX / GLYCOLAX) packet Take 17 g by mouth See admin instructions. Mix 1 capful (17 gm) in 8 oz of water twice daily until regular bowel movements occur, then start once daily as needed for constipation.   Yes Historical Provider, MD  risperiDONE (RISPERDAL) 3 MG tablet Take 3 mg by mouth 2 (two) times daily.    Yes Historical Provider, MD  Tamsulosin HCl (FLOMAX) 0.4 MG CAPS Take 0.4 mg by mouth daily.    Yes Historical Provider, MD  traZODone (DESYREL) 150 MG tablet Take 150 mg by mouth at bedtime.  01/28/12  Yes Historical Provider, MD    Physical Exam: Filed Vitals:   09/30/14 1845 09/30/14 1900 09/30/14 1930 09/30/14 2000  BP:  116/74 112/82 113/80  Pulse: 73     Temp:      TempSrc:      Resp: 18     Height:      Weight:      SpO2: 97%        General:  Moderately built and nourished.  Eyes: Anicteric no pallor.  ENT: No  discharge from the ears eyes nose and mouth.  Neck: No JVD appreciated.  Cardiovascular: S1 and S2 heard.  Respiratory: No rhonchi or crepitations.  Abdomen: Soft nontender bowel sounds present.  Skin: No rash.  Musculoskeletal: No edema.  Psychiatric: Appears normal.  Neurologic: Alert awake oriented to time place and person. Moves all extremities.  Labs on Admission:  Basic Metabolic Panel:  Recent Labs Lab 09/30/14 1613  NA 138  K 3.4*  CL 100  CO2 28  GLUCOSE 164*  BUN 15  CREATININE 0.91  CALCIUM 9.0   Liver Function Tests:  Recent Labs Lab 09/30/14 1613  AST 16  ALT 12  ALKPHOS 43  BILITOT 0.8  PROT 7.2  ALBUMIN 3.2*    Recent  Labs Lab 09/30/14 1613  LIPASE 22   No results for input(s): AMMONIA in the last 168 hours. CBC:  Recent Labs Lab 09/30/14 1613  WBC 9.6  NEUTROABS 6.8  HGB 11.2*  HCT 35.0*  MCV 85.8  PLT 255   Cardiac Enzymes:  Recent Labs Lab 09/30/14 1613 09/30/14 1939  TROPONINI <0.03 <0.03    BNP (last 3 results)  Recent Labs  09/30/14 1614  BNP 36.0    ProBNP (last 3 results) No results for input(s): PROBNP in the last 8760 hours.  CBG: No results for input(s): GLUCAP in the last 168 hours.  Radiological Exams on Admission: Dg Chest 2 View  09/30/2014   CLINICAL DATA:  Chest pain  EXAM: CHEST  2 VIEW  COMPARISON:  09/06/2014, 09/09/2014  FINDINGS: Cardiac shadow is within normal limits. The lungs are well aerated bilaterally. Right apical mass lesion and right hilar mass lesion are again seen and stable. No new focal infiltrate or sizable effusion is noted. No bony abnormality is seen.  IMPRESSION: Persistent right hilar and right apical mass lesion. The overall appearance is stable from the prior study.   Electronically Signed   By: Inez Catalina M.D.   On: 09/30/2014 17:18   Ct Angio Chest Pe W/cm &/or Wo Cm  09/30/2014   CLINICAL DATA:  Right-sided chest pain since this morning.  EXAM: CT ANGIOGRAPHY  CHEST WITH CONTRAST  TECHNIQUE: Multidetector CT imaging of the chest was performed using the standard protocol during bolus administration of intravenous contrast. Multiplanar CT image reconstructions and MIPs were obtained to evaluate the vascular anatomy.  CONTRAST:  159m OMNIPAQUE IOHEXOL 350 MG/ML SOLN  COMPARISON:  CT scan of the chest dated 09/09/2014 and chest x-ray dated 09/30/2014  FINDINGS: There has been marked progression of the articular mass in the right lung apex, now all less well-defined and now measuring 5.9 x 2.8 cm, increased from 4.2 by 1.6 cm.  There is increased right hilar adenopathy now measuring 3.9 x 3.4 cm, increased from 3.3 x 2.9 cm. Precarinal lymph node is increased from 12 mm to 14 mm. Right hilar adenopathy has increased inferiorly.  There is marked enlargement of the right lobe of the thyroid gland, measuring 4.8 x 3.4 cm, increased from 4.4 x 3.2 cm.  New tiny right effusion.  New  cardiomegaly.  2.1 cm nodule in the left adrenal gland is relatively dense and could represent metastatic disease.  Multiple stones in the gallbladder.  Review of the MIP images confirms the above findings.  IMPRESSION: 1. Marked increase in the right apical lung mass with new hazy density in the surrounding lung. Increased hilar and mediastinal adenopathy. 2. New cardiomegaly. 3. Interval enlargement of the right lobe of the thyroid gland. 4. New tiny right effusion.   Electronically Signed   By: JLorriane ShireM.D.   On: 09/30/2014 19:46    EKG: Independently reviewed. Normal sinus rhythm with PVCs.  Assessment/Plan Principal Problem:   Chest pain Active Problems:   HYPERTENSION, BENIGN ESSENTIAL   Schizophrenia   PVC's (premature ventricular contractions)   Lung mass   Diabetes mellitus type 2, controlled   1. Chest pain, appears atypical - patient's chest. Most of the right-sided stabbing in nature and increases on deep inspiration. Pain appears atypical at this time but given  patient's risk factors including diabetes hypertension and ongoing tobacco abuse we will cycle cardiac markers and check 2-D echo. Patient's CT chest also shows new cardiomegaly with tiny right effusion.  Check EF. 2. Right lung mass which is wheezing in size - I have requested pulmonary consult for further recommendations. 3. Enlargement of the right lobe of the thyroid gland - check thyroid function test and patient will need further workup on this as an outpatient. 4. Anemia - follow CBC. Further workup as outpatient if there is no significant fall in hemoglobin. 5. Diabetes mellitus type 2 - since patient received contrast holding off metformin for now. Patient is placed on sliding scale coverage. 6. Hypertension - continue lisinopril HCTZ and metoprolol. 7. Schizophrenia - continue home medications.   DVT Prophylaxis Lovenox.  Code Status: Full code.  Family Communication: Discussed with patient.  Disposition Plan: Admit for observation.    Fortune Torosian N. [facility] Pager [phone].  If 7PM-7AM, please contact night-coverage [email or web address] Password [facility] 09/30/2014, 8:33 PM

## 2014-09-30 NOTE — ED Provider Notes (Signed)
CSN: [id number]     Arrival date & time 09/30/14  1530 History   First MD Initiated Contact with Patient 09/30/14 1535     Chief Complaint  Patient presents with  . Chest Pain     (Consider location/radiation/quality/duration/timing/severity/associated sxs/prior Treatment) HPI Comments: Patient from group home with episode of right-sided chest pain onset at rest. The pain was constant and did not radiate. It is worse with movement of his torso. It is resolved now after lasting about 3 hours. Reportssimilar pain in the past. Denies any cardiac history. Associated with some shortness of breath. No nausea, vomiting, fever, cough or diaphoresis. Patient is a smoker but there is no history of asthma or COPD. He has hyperlipidemia, diabetes, hypertension. He has a history of mental retardation and schizophrenia so history is limited. Caregiver reports no stress test in the past.  The history is provided by the patient and a caregiver. The history is limited by the condition of the patient.    Past Medical History  Diagnosis Date  . Mixed hyperlipidemia   . Essential hypertension, benign   . Schizophrenia   . Positive PPD     Treated  . DM2 (diabetes mellitus, type 2)   . Diverticulosis     Pancolonic   . Hemorrhoids   . PVC's (premature ventricular contractions)   . Syncope   . Mentally challenged   . History of GI diverticular bleed    Past Surgical History  Procedure Laterality Date  . Colonoscopy   07/11/2004     Rehman-Pancolonic diverticulosis/Small external hemorrhoids/ The colon was full of coffee-ground material, but no active bleeding  . Esophagogastroduodenoscopy  07/11/2004    Rehman-incomplete ring GEJ, Small sliding hiatal hernia/Bulbar duodenitis without stigmata of bleeding  . Givens capsule study  01/10/2005    Rehman-Few petechiae noted at involving gastric mucosa as well as duodenum and  jejunum/ the  surface of the small bowel mucosa could not be seen because of  food   debris limiting the quality of this study.  . Colonoscopy  03/29/2008    Large mouth ascending colon and sigmoid colon diverticula which were frequent.  Otherwise, no polyps, masses, inflammatory changes or AVM seen. /Small internal hemorrhoids, otherwise normal retroflexed view of the rectum  . Arm surgery      left  . Esophagogastroduodenoscopy (egd) with esophageal dilation  04/17/2012    VQQ:[id number] ZENKER'S DIVERTICULUM OR PRIMARY ESOPHGEAL MOILITY DISORDER/MODERATE Erosive gastritis/MILD Duodenal inflammation    Family History  Problem Relation Age of Onset  . Stroke Father   . Stroke Mother   . CAD Mother    History  Substance Use Topics  . Smoking status: Current Every Day Smoker -- 0.50 packs/day for 50 years    Types: Cigarettes  . Smokeless tobacco: Not on file  . Alcohol Use: No    Review of Systems  Constitutional: Negative for fever and activity change.  HENT: Negative for congestion and rhinorrhea.   Respiratory: Positive for cough, chest tightness and shortness of breath.   Cardiovascular: Positive for chest pain.  Gastrointestinal: Negative for nausea, vomiting and abdominal pain.  Genitourinary: Negative for dysuria, hematuria and testicular pain.  Musculoskeletal: Negative for myalgias and arthralgias.  Skin: Negative for wound.  Neurological: Negative for dizziness, weakness and headaches.  A complete 10 system review of systems was obtained and all systems are negative except as noted in the HPI and PMH.      Allergies  Review of patient's allergies indicates no  known allergies.  Home Medications   Prior to Admission medications   Medication Sig Start Date End Date Taking? Authorizing Provider  acetaminophen (TYLENOL) 325 MG tablet Take 650 mg by mouth every 6 (six) hours as needed. Pain.   Yes Historical Provider, MD  albuterol (PROAIR HFA) 108 (90 BASE) MCG/ACT inhaler Inhale 2 puffs into the lungs every 4 (four) hours as needed. FOR  SHORTNESS OF BREATH   Yes Historical Provider, MD  amLODipine (NORVASC) 5 MG tablet Take 5 mg by mouth daily.     Yes Historical Provider, MD  aspirin (ASPIR-LOW) 81 MG EC tablet Take 81 mg by mouth daily.     Yes Historical Provider, MD  Cholecalciferol (VITAMIN D) 2000 UNITS tablet Take 2,000 Units by mouth daily.   Yes Historical Provider, MD  dutasteride (AVODART) 0.5 MG capsule Take 0.5 mg by mouth daily.     Yes Historical Provider, MD  econazole nitrate 1 % cream Apply 1 application topically 2 (two) times daily.  12/23/11  Yes Historical Provider, MD  fish oil-omega-3 fatty acids 1000 MG capsule Take 1 g by mouth 2 (two) times daily.   Yes Historical Provider, MD  fluticasone (FLONASE) 50 MCG/ACT nasal spray Place 1 spray into the nose daily.  01/23/12  Yes Historical Provider, MD  lisinopril-hydrochlorothiazide (PRINZIDE,ZESTORETIC) 20-12.5 MG per tablet Take 1 tablet by mouth daily.    Yes Historical Provider, MD  lovastatin (MEVACOR) 40 MG tablet Take 40 mg by mouth at bedtime.    Yes Historical Provider, MD  meloxicam (MOBIC) 15 MG tablet Take 15 mg by mouth daily as needed for pain.    Yes Historical Provider, MD  metFORMIN (GLUCOPHAGE) 500 MG tablet Take 500 mg by mouth 2 (two) times daily with a meal.    Yes Historical Provider, MD  metoprolol succinate (TOPROL XL) 25 MG 24 hr tablet Take 25 mg by mouth daily.     Yes Historical Provider, MD  Multiple Vitamins-Minerals (MULTIVITAMIN WITH MINERALS) tablet Take 1 tablet by mouth daily.   Yes Historical Provider, MD  omeprazole (PRILOSEC) 20 MG capsule Take 20 mg by mouth every morning. 1 po every morning 04/17/12  Yes Danie Binder, MD  polyethylene glycol (MIRALAX / GLYCOLAX) packet Take 17 g by mouth See admin instructions. Mix 1 capful (17 gm) in 8 oz of water twice daily until regular bowel movements occur, then start once daily as needed for constipation.   Yes Historical Provider, MD  risperiDONE (RISPERDAL) 3 MG tablet Take 3 mg by  mouth 2 (two) times daily.    Yes Historical Provider, MD  Tamsulosin HCl (FLOMAX) 0.4 MG CAPS Take 0.4 mg by mouth daily.    Yes Historical Provider, MD  traZODone (DESYREL) 150 MG tablet Take 150 mg by mouth at bedtime.  01/28/12  Yes Historical Provider, MD   BP 130/84 mmHg  Pulse 63  Temp(Src) 97.7 F (36.5 C) (Oral)  Resp 18  Ht '5\' 6"'$  (1.676 m)  Wt 195 lb 11.2 oz (88.769 kg)  BMI 31.60 kg/m2  SpO2 100% Physical Exam  Constitutional: He is oriented to person, place, and time. He appears well-developed and well-nourished. No distress.  HENT:  Head: Normocephalic and atraumatic.  Mouth/Throat: Oropharynx is clear and moist. No oropharyngeal exudate.  Eyes: Conjunctivae and EOM are normal. Pupils are equal, round, and reactive to light.  Neck: Normal range of motion. Neck supple.  No meningismus.  Cardiovascular: Normal rate, regular rhythm, normal heart sounds and intact distal  pulses.   No murmur heard. Pulmonary/Chest: Effort normal. No respiratory distress. He exhibits no tenderness.  Shallow breathing and tachypnea. Reduced breath sounds with scattered expiratory wheezing.  Abdominal: Soft. There is no tenderness. There is no rebound and no guarding.  Musculoskeletal: Normal range of motion. He exhibits no edema or tenderness.  Neurological: He is alert and oriented to person, place, and time. No cranial nerve deficit. He exhibits normal muscle tone. Coordination normal.  No ataxia on finger to nose bilaterally. No pronator drift. 5/5 strength throughout. CN 2-12 intact. Negative Romberg. Equal grip strength. Sensation intact. Gait is normal.   Skin: Skin is warm.  Psychiatric: He has a normal mood and affect. His behavior is normal.  Nursing note and vitals reviewed.   ED Course  Procedures (including critical care time) Labs Review Labs Reviewed  CBC WITH DIFFERENTIAL/PLATELET - Abnormal; Notable for the following:    RBC 4.08 (*)    Hemoglobin 11.2 (*)    HCT 35.0  (*)    All other components within normal limits  COMPREHENSIVE METABOLIC PANEL - Abnormal; Notable for the following:    Potassium 3.4 (*)    Glucose, Bld 164 (*)    Albumin 3.2 (*)    GFR calc non Af Amer 81 (*)    All other components within normal limits  D-DIMER, QUANTITATIVE - Abnormal; Notable for the following:    D-Dimer, Quant 1.25 (*)    All other components within normal limits  LIPASE, BLOOD  TROPONIN I  BRAIN NATRIURETIC PEPTIDE  TROPONIN I    Imaging Review Dg Chest 2 View  09/30/2014   CLINICAL DATA:  Chest pain  EXAM: CHEST  2 VIEW  COMPARISON:  09/06/2014, 09/09/2014  FINDINGS: Cardiac shadow is within normal limits. The lungs are well aerated bilaterally. Right apical mass lesion and right hilar mass lesion are again seen and stable. No new focal infiltrate or sizable effusion is noted. No bony abnormality is seen.  IMPRESSION: Persistent right hilar and right apical mass lesion. The overall appearance is stable from the prior study.   Electronically Signed   By: Inez Catalina M.D.   On: 09/30/2014 17:18   Ct Angio Chest Pe W/cm &/or Wo Cm  09/30/2014   CLINICAL DATA:  Right-sided chest pain since this morning.  EXAM: CT ANGIOGRAPHY CHEST WITH CONTRAST  TECHNIQUE: Multidetector CT imaging of the chest was performed using the standard protocol during bolus administration of intravenous contrast. Multiplanar CT image reconstructions and MIPs were obtained to evaluate the vascular anatomy.  CONTRAST:  148m OMNIPAQUE IOHEXOL 350 MG/ML SOLN  COMPARISON:  CT scan of the chest dated 09/09/2014 and chest x-ray dated 09/30/2014  FINDINGS: There has been marked progression of the articular mass in the right lung apex, now all less well-defined and now measuring 5.9 x 2.8 cm, increased from 4.2 by 1.6 cm.  There is increased right hilar adenopathy now measuring 3.9 x 3.4 cm, increased from 3.3 x 2.9 cm. Precarinal lymph node is increased from 12 mm to 14 mm. Right hilar adenopathy has  increased inferiorly.  There is marked enlargement of the right lobe of the thyroid gland, measuring 4.8 x 3.4 cm, increased from 4.4 x 3.2 cm.  New tiny right effusion.  New  cardiomegaly.  2.1 cm nodule in the left adrenal gland is relatively dense and could represent metastatic disease.  Multiple stones in the gallbladder.  Review of the MIP images confirms the above findings.  IMPRESSION: 1. Marked  increase in the right apical lung mass with new hazy density in the surrounding lung. Increased hilar and mediastinal adenopathy. 2. New cardiomegaly. 3. Interval enlargement of the right lobe of the thyroid gland. 4. New tiny right effusion.   Electronically Signed   By: Lorriane Shire M.D.   On: 09/30/2014 19:46     EKG Interpretation   Date/Time:  Friday September 30 2014 15:38:44 EDT Ventricular Rate:  72 PR Interval:  159 QRS Duration: 110 QT Interval:  379 QTC Calculation: 415 R Axis:   83 Text Interpretation:  Sinus rhythm Multiform ventricular premature  complexes Borderline right axis deviation Nonspecific repol abnormality,  inferior leads Confirmed by ZAMMIT  MD, JOSEPH ([id number]) on 09/30/2014  3:44:02 PM      MDM   Final diagnoses:  Lung mass  Atypical chest pain   Right-sided chest pain lasting for several hours, now resolved. EKG sinus rhythm with PVCs. Patient with tachypnea and shallow breathing on exam.  Nebulizer, chest x-ray, labs  Chest x-ray shows stable right upper lobe mass. Patient caregiver not aware of this. Record review shows he had a CT scan done April 1 which showed a lung mass concerning for carcinoma as well as hilar lymphadenopathy  His work of breathing is improved after nebulizer. His chest pain has resolved. Troponin negative 2. Atypical for ACS.  CT scan shows no PE but does show enlarging right apical lung mass with increasing adenopathy and new cardiomegaly. There is also enlargement of his thyroid gland.  Concern for progressive malignancy and  patient has been lost to outpatient follow-up. Admission discussed with Dr. Hal Hope.    Ezequiel Essex, MD 09/30/14 2204

## 2014-10-01 DIAGNOSIS — R079 Chest pain, unspecified: Secondary | ICD-10-CM

## 2014-10-01 DIAGNOSIS — F209 Schizophrenia, unspecified: Secondary | ICD-10-CM

## 2014-10-01 LAB — TROPONIN I
Troponin I: <0.03 ng/mL (ref <0.031)
Troponin I: <0.03 ng/mL (ref <0.031)

## 2014-10-01 LAB — APTT: aPTT: 33 seconds (ref 24–37)

## 2014-10-01 LAB — MRSA PCR SCREENING: MRSA BY PCR: NEGATIVE

## 2014-10-01 LAB — CBC
HEMATOCRIT: 34.8 % — AB (ref 39.0–52.0)
HEMOGLOBIN: 11.4 g/dL — AB (ref 13.0–17.0)
MCH: 27.9 pg (ref 26.0–34.0)
MCHC: 32.8 g/dL (ref 30.0–36.0)
MCV: 85.1 fL (ref 78.0–100.0)
Platelets: 267 10*3/uL (ref 150–400)
RBC: 4.09 MIL/uL — AB (ref 4.22–5.81)
RDW: 13.5 % (ref 11.5–15.5)
WBC: 10 10*3/uL (ref 4.0–10.5)

## 2014-10-01 LAB — GLUCOSE, CAPILLARY
GLUCOSE-CAPILLARY: 93 mg/dL (ref 70–99)
Glucose-Capillary: 113 mg/dL — ABNORMAL HIGH (ref 70–99)
Glucose-Capillary: 95 mg/dL (ref 70–99)
Glucose-Capillary: 116 mg/dL — ABNORMAL HIGH (ref 70–99)

## 2014-10-01 LAB — CREATININE, SERUM
Creatinine, Ser: 0.8 mg/dL (ref 0.50–1.35)
GFR calc non Af Amer: 86 mL/min — ABNORMAL LOW (ref >90)

## 2014-10-01 LAB — PROTIME-INR
INR: 1.09 (ref 0.00–1.49)
Prothrombin Time: 14.2 seconds (ref 11.6–15.2)

## 2014-10-01 LAB — TSH: TSH: 1.071 u[IU]/mL (ref 0.350–4.500)

## 2014-10-01 LAB — MAGNESIUM: Magnesium: 1.7 mg/dL (ref 1.5–2.5)

## 2014-10-01 MED ORDER — KETOCONAZOLE 2 % EX CREA
TOPICAL_CREAM | Freq: Two times a day (BID) | CUTANEOUS | Status: DC
  Administered 2014-10-01 – 2014-10-02 (×3): via TOPICAL
  Filled 2014-10-01: qty 15

## 2014-10-01 NOTE — Progress Notes (Signed)
  Echocardiogram 2D Echocardiogram has been performed.  Valarie Cones 10/01/2014, 9:49 AM

## 2014-10-01 NOTE — Consult Note (Signed)
Consult requested by: [facility] Consult requested for abnormal chest CT:  HPI: This is a 74 year old who had outpatient CT of the chest done about 3 weeks ago. This showed mediastinal adenopathy and a right upper lobe mass. He also had enlarged thyroid. He continued to have difficulty was having chest pain and came to the emergency department. He had repeat CT which shows that his mass lesion in his chest has enlarged and the area and his thyroid is larger also. He has not as best I can tell been set up for any sort of biopsy as yet he says he has an appointment with someone in [place] but is not sure when it is. His history is sketchy with history of schizophrenia and mental challenged  Past Medical History  Diagnosis Date  . Mixed hyperlipidemia   . Essential hypertension, benign   . Schizophrenia   . Positive PPD     Treated  . DM2 (diabetes mellitus, type 2)   . Diverticulosis     Pancolonic   . Hemorrhoids   . PVC's (premature ventricular contractions)   . Syncope   . Mentally challenged   . History of GI diverticular bleed      Family History  Problem Relation Age of Onset  . Stroke Father   . Stroke Mother   . CAD Mother      History   Social History  . Marital Status: Single    Spouse Name: N/A  . Number of Children: N/A  . Years of Education: N/A   Occupational History  . Janitor     Retired   Social History Main Topics  . Smoking status: Current Every Day Smoker -- 0.50 packs/day for 50 years    Types: Cigarettes  . Smokeless tobacco: Not on file  . Alcohol Use: No  . 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 Sexual Activity: Not on file   Other Topics Concern  . None   Social History Narrative   [facility]. RetiredGarment/textile technologist.      ROS: He's had chest pain. He has not had hemoptysis. He doesn't think he's lost any weight. The rest per the history and physical    Objective: Vital signs in  last 24 hours: Temp:  [97.6 F (36.4 C)-98 F (36.7 C)] 98 F (36.7 C) (04/23 0550) Pulse Rate:  [25-73] 66 (04/23 0550) Resp:  [14-41] 18 (04/23 0550) BP: (99-130)/(65-84) 111/80 mmHg (04/23 0550) SpO2:  [94 %-100 %] 100 % (04/23 0550) FiO2 (%):  [21 %] 21 % (04/22 2301) Weight:  [88.451 kg (195 lb)-88.769 kg (195 lb 11.2 oz)] 88.769 kg (195 lb 11.2 oz) (04/22 2142) Weight change:  Last BM Date: 10/01/14  Intake/Output from previous day: 04/22 0701 - 04/23 0700 In: -  Out: 300 [Urine:300]  PHYSICAL EXAM He is awake and alert. His HEENT is unremarkable. His neck is supple. His chest shows rhonchi bilaterally. Heart is regular without gallop. Abdomen is soft with no masses. Extremities showed no edema.  Lab Results: Basic Metabolic Panel:  Recent Labs  09/30/14 1613 09/30/14 2340  NA 138  --   K 3.4*  --   CL 100  --   CO2 28  --   GLUCOSE 164*  --   BUN 15  --   CREATININE 0.91 0.80  CALCIUM 9.0  --   MG  --  1.7   Liver Function  Tests:  Recent Labs  09/30/14 1613  AST 16  ALT 12  ALKPHOS 43  BILITOT 0.8  PROT 7.2  ALBUMIN 3.2*    Recent Labs  09/30/14 1613  LIPASE 22   No results for input(s): AMMONIA in the last 72 hours. CBC:  Recent Labs  09/30/14 1613 09/30/14 2340  WBC 9.6 10.0  NEUTROABS 6.8  --   HGB 11.2* 11.4*  HCT 35.0* 34.8*  MCV 85.8 85.1  PLT 255 267   Cardiac Enzymes:  Recent Labs  09/30/14 1613 09/30/14 1939 09/30/14 2340  TROPONINI <0.03 <0.03 <0.03   BNP: No results for input(s): PROBNP in the last 72 hours. D-Dimer:  Recent Labs  09/30/14 1613  DDIMER 1.25*   CBG:  Recent Labs  10/01/14 0918  GLUCAP 93   Hemoglobin A1C: No results for input(s): HGBA1C in the last 72 hours. Fasting Lipid Panel: No results for input(s): CHOL, HDL, LDLCALC, TRIG, CHOLHDL, LDLDIRECT in the last 72 hours. Thyroid Function Tests:  Recent Labs  09/30/14 2340  TSH 1.071   Anemia Panel: No results for input(s):  VITAMINB12, FOLATE, FERRITIN, TIBC, IRON, RETICCTPCT in the last 72 hours. Coagulation: No results for input(s): LABPROT, INR in the last 72 hours. Urine Drug Screen: Drugs of Abuse  No results found for: LABOPIA, COCAINSCRNUR, LABBENZ, AMPHETMU, THCU, LABBARB  Alcohol Level: No results for input(s): ETH in the last 72 hours. Urinalysis: No results for input(s): COLORURINE, LABSPEC, PHURINE, GLUCOSEU, HGBUR, BILIRUBINUR, KETONESUR, PROTEINUR, UROBILINOGEN, NITRITE, LEUKOCYTESUR in the last 72 hours.  Invalid input(s): APPERANCEUR Misc. Labs:   ABGS: No results for input(s): PHART, PO2ART, TCO2, HCO3 in the last 72 hours.  Invalid input(s): PCO2   MICROBIOLOGY: Recent Results (from the past 240 hour(s))  MRSA PCR Screening     Status: None   Collection Time: 09/30/14 11:59 PM  Result Value Ref Range Status   MRSA by PCR NEGATIVE NEGATIVE Final    Comment:        The GeneXpert MRSA Assay (FDA approved for NASAL specimens only), is one component of a comprehensive MRSA colonization surveillance program. It is not intended to diagnose MRSA infection nor to guide or monitor treatment for MRSA infections.     Studies/Results: Dg Chest 2 View  09/30/2014   CLINICAL DATA:  Chest pain  EXAM: CHEST  2 VIEW  COMPARISON:  09/06/2014, 09/09/2014  FINDINGS: Cardiac shadow is within normal limits. The lungs are well aerated bilaterally. Right apical mass lesion and right hilar mass lesion are again seen and stable. No new focal infiltrate or sizable effusion is noted. No bony abnormality is seen.  IMPRESSION: Persistent right hilar and right apical mass lesion. The overall appearance is stable from the prior study.   Electronically Signed   By: Inez Catalina M.D.   On: 09/30/2014 17:18   Ct Angio Chest Pe W/cm &/or Wo Cm  09/30/2014   CLINICAL DATA:  Right-sided chest pain since this morning.  EXAM: CT ANGIOGRAPHY CHEST WITH CONTRAST  TECHNIQUE: Multidetector CT imaging of the chest was  performed using the standard protocol during bolus administration of intravenous contrast. Multiplanar CT image reconstructions and MIPs were obtained to evaluate the vascular anatomy.  CONTRAST:  116m OMNIPAQUE IOHEXOL 350 MG/ML SOLN  COMPARISON:  CT scan of the chest dated 09/09/2014 and chest x-ray dated 09/30/2014  FINDINGS: There has been marked progression of the articular mass in the right lung apex, now all less well-defined and now measuring 5.9 x 2.8 cm,  increased from 4.2 by 1.6 cm.  There is increased right hilar adenopathy now measuring 3.9 x 3.4 cm, increased from 3.3 x 2.9 cm. Precarinal lymph node is increased from 12 mm to 14 mm. Right hilar adenopathy has increased inferiorly.  There is marked enlargement of the right lobe of the thyroid gland, measuring 4.8 x 3.4 cm, increased from 4.4 x 3.2 cm.  New tiny right effusion.  New  cardiomegaly.  2.1 cm nodule in the left adrenal gland is relatively dense and could represent metastatic disease.  Multiple stones in the gallbladder.  Review of the MIP images confirms the above findings.  IMPRESSION: 1. Marked increase in the right apical lung mass with new hazy density in the surrounding lung. Increased hilar and mediastinal adenopathy. 2. New cardiomegaly. 3. Interval enlargement of the right lobe of the thyroid gland. 4. New tiny right effusion.   Electronically Signed   By: Lorriane Shire M.D.   On: 09/30/2014 19:46    Medications:  Prior to Admission:  Prescriptions prior to admission  Medication Sig Dispense Refill Last Dose  . acetaminophen (TYLENOL) 325 MG tablet Take 650 mg by mouth every 6 (six) hours as needed. Pain.   unknown  . albuterol (PROAIR HFA) 108 (90 BASE) MCG/ACT inhaler Inhale 2 puffs into the lungs every 4 (four) hours as needed. FOR SHORTNESS OF BREATH   unknown  . amLODipine (NORVASC) 5 MG tablet Take 5 mg by mouth daily.     09/30/2014 at Unknown time  . aspirin (ASPIR-LOW) 81 MG EC tablet Take 81 mg by mouth daily.      09/30/2014 at Unknown time  . Cholecalciferol (VITAMIN D) 2000 UNITS tablet Take 2,000 Units by mouth daily.   09/30/2014 at Unknown time  . dutasteride (AVODART) 0.5 MG capsule Take 0.5 mg by mouth daily.     09/30/2014 at Unknown time  . econazole nitrate 1 % cream Apply 1 application topically 2 (two) times daily.    09/30/2014 at Unknown time  . fish oil-omega-3 fatty acids 1000 MG capsule Take 1 g by mouth 2 (two) times daily.   09/30/2014 at Unknown time  . fluticasone (FLONASE) 50 MCG/ACT nasal spray Place 1 spray into the nose daily.    09/30/2014 at Unknown time  . lisinopril-hydrochlorothiazide (PRINZIDE,ZESTORETIC) 20-12.5 MG per tablet Take 1 tablet by mouth daily.    09/30/2014 at Unknown time  . lovastatin (MEVACOR) 40 MG tablet Take 40 mg by mouth at bedtime.    09/29/2014 at Unknown time  . meloxicam (MOBIC) 15 MG tablet Take 15 mg by mouth daily as needed for pain.    unknown  . metFORMIN (GLUCOPHAGE) 500 MG tablet Take 500 mg by mouth 2 (two) times daily with a meal.    09/30/2014 at Unknown time  . metoprolol succinate (TOPROL XL) 25 MG 24 hr tablet Take 25 mg by mouth daily.     09/30/2014 at [place]  . Multiple Vitamins-Minerals (MULTIVITAMIN WITH MINERALS) tablet Take 1 tablet by mouth daily.   09/30/2014 at Unknown time  . omeprazole (PRILOSEC) 20 MG capsule Take 20 mg by mouth every morning. 1 po every morning   09/30/2014 at Unknown time  . polyethylene glycol (MIRALAX / GLYCOLAX) packet Take 17 g by mouth See admin instructions. Mix 1 capful (17 gm) in 8 oz of water twice daily until regular bowel movements occur, then start once daily as needed for constipation.   unknown  . risperiDONE (RISPERDAL) 3 MG tablet Take  3 mg by mouth 2 (two) times daily.    09/30/2014 at Unknown time  . Tamsulosin HCl (FLOMAX) 0.4 MG CAPS Take 0.4 mg by mouth daily.    09/29/2014 at Unknown time  . traZODone (DESYREL) 150 MG tablet Take 150 mg by mouth at bedtime.    09/29/2014 at Unknown time   Scheduled: .  amLODipine  5 mg Oral Daily  . aspirin EC  325 mg Oral Daily  . cholecalciferol  2,000 Units Oral Daily  . dutasteride  0.5 mg Oral Daily  . enoxaparin (LOVENOX) injection  40 mg Subcutaneous Q24H  . fluticasone  1 spray Each Nare Daily  . hydrochlorothiazide  12.5 mg Oral Daily  . insulin aspart  0-9 Units Subcutaneous TID WC  . ketoconazole   Topical BID  . lisinopril  20 mg Oral Daily  . metoprolol succinate  25 mg Oral Daily  . multivitamin with minerals  1 tablet Oral Daily  . omega-3 acid ethyl esters  1 g Oral BID  . pantoprazole  40 mg Oral Daily  . pravastatin  40 mg Oral q1800  . risperiDONE  3 mg Oral BID  . sodium chloride  3 mL Intravenous Q12H  . tamsulosin  0.4 mg Oral Daily  . traZODone  150 mg Oral QHS   Continuous:  OXB:[id number] **OR** acetaminophen, albuterol, ondansetron **OR** ondansetron (ZOFRAN) IV, polyethylene glycol  Assesment: He has a mass in his lung. He came in with chest pain is probably related to his lung mass. He says he feels better. He is going to need to have some sort of a biopsy and he may probably should have biopsy of both his thyroid and his lung mass. Principal Problem:   Chest pain Active Problems:   HYPERTENSION, BENIGN ESSENTIAL   Schizophrenia   PVC's (premature ventricular contractions)   Lung mass   Diabetes mellitus type 2, controlled    Plan: Discussed with Dr. Ree Kida. She plans to contact interventional radiology I think this is appropriate    LOS: 1 day   Abdallah Hern L 10/01/2014, 10:44 AM

## 2014-10-01 NOTE — Progress Notes (Signed)
[name], patient's caregiver at [facility], called asking for an update on patient's progress. Patient gave permission to provide information to Ms. Owens Shark. Update given.

## 2014-10-01 NOTE — Progress Notes (Signed)
[facility]                                                                              Patient Demographics  Alejandro [name], is a 74 y.o. male, DOB - [birth year], SWF:[id number]  Admit date - 09/30/2014   Admitting Physician Rise Patience, MD  Outpatient Primary MD for the patient is ROBERTSON, Thomasena Edis, PA-C  LOS - 1   Chief Complaint  Patient presents with  . Chest Pain      HPI on 09/30/2014 by Dr. Gean Birchwood Alejandro [name] is a 74 y.o. male history of diabetes mellitus type 2, hypertension, ongoing tobacco abuse, schizophrenia was brought to the ER from group home after patient had chest pain. Patient had chest pain since last night. Patient chest pain is mostly on the right side on deep inspiration. Stabbing in nature. Patient also has been having some productive cough denies any fever or chills. The ER patient had elevated d-dimer and had undergone CT angiogram of the chest which shows right-sided lung mass which is enlarging. EKG and cardiac markers were unremarkable. Patient has been admitted for further management. Patient denies any shortness of breath palpitations diaphoresis. Denies any abdominal pain nausea vomiting or diarrhea.   Assessment & Plan   Atypical chest pain -Mostly located on the right side, worse with inspiration -Troponins negative -Echocardiogram: EF 22-02%, grade 1 diastolic dysfunction -CTA: marked increase in right apical lung mass with new hazy density  -Continue metoprolol, aspirin, lisinopril, statin  Combined Diastolic/Systolic CHF -Currently not in exacerbation, appears to be euvolemic -Echocardiogram: EF 54-27%, grade 1 diastolic dysfunction -BNP 36 -Monitor daily weights, intake and output  Right lung mass/Thyroid gland nodule -Increasing in size as compared to previous CT 09/09/2014 -Pulmonology consulted and appreciated, and agreed with lung biopsy -Unable to perform bronchoscopy at [place] consulted and  appreciated  Normocytic Anemia -Hemoglobin stable 11.4 (was 13 in 2013) -Continue to monitor CBC  Diabetes Mellitus, Type 2 -Metformin held -Continue ISS with CBG monitoring   Essential hypertension -Stable. Continue norvasc, metoprolol, lisinopril, HCTZ  History of schizophrenia  -Patient resides at a group home -Continue risperdal  Code Status: Full  Family Communication: None at bedside  Disposition Plan: Admitted.  Pending biopsy  Time Spent in minutes   30 minutes  Procedures  Echocardiogram  Consults   Oncology Pulmonology Interventional radiology  DVT Prophylaxis  Lovenox  Lab Results  Component Value Date   PLT 267 09/30/2014    Medications  Scheduled Meds: . amLODipine  5 mg Oral Daily  . aspirin EC  325 mg Oral Daily  . cholecalciferol  2,000 Units Oral Daily  . dutasteride  0.5 mg Oral Daily  . enoxaparin (LOVENOX) injection  40 mg Subcutaneous Q24H  . fluticasone  1 spray Each Nare Daily  . hydrochlorothiazide  12.5 mg Oral Daily  . insulin aspart  0-9 Units Subcutaneous TID WC  . ketoconazole   Topical BID  . lisinopril  20 mg Oral Daily  . metoprolol succinate  25 mg Oral Daily  . multivitamin with minerals  1 tablet Oral Daily  . omega-3 acid ethyl esters  1 g Oral BID  .  pantoprazole  40 mg Oral Daily  . pravastatin  40 mg Oral q1800  . risperiDONE  3 mg Oral BID  . sodium chloride  3 mL Intravenous Q12H  . tamsulosin  0.4 mg Oral Daily  . traZODone  150 mg Oral QHS   Continuous Infusions:  PRN Meds:.acetaminophen **OR** acetaminophen, albuterol, ondansetron **OR** ondansetron (ZOFRAN) IV, polyethylene glycol  Antibiotics    Anti-infectives    None        Subjective:   Alejandro [name] seen and examined today.  Patient states his chest pain is on the right side and is worse with inspiration.  It is a sharp type of pain that comes and goes.  Denies shortness of breath, abdominal pain, N/V/D/C.     Objective:   Filed Vitals:    09/30/14 2100 09/30/14 2142 09/30/14 2301 10/01/14 0550  BP: 121/83 130/84  111/80  Pulse:  63  66  Temp:  97.7 F (36.5 C)  98 F (36.7 C)  TempSrc:  Oral  Oral  Resp:  18  18  Height:  '5\' 6"'$  (1.676 m)    Weight:  88.769 kg (195 lb 11.2 oz)    SpO2:  100% 100% 100%    Wt Readings from Last 3 Encounters:  09/30/14 88.769 kg (195 lb 11.2 oz)  07/07/14 90.175 kg (198 lb 12.8 oz)  10/20/13 92.806 kg (204 lb 9.6 oz)     Intake/Output Summary (Last 24 hours) at 10/01/14 1343 Last data filed at 10/01/14 0900  Gross per 24 hour  Intake    240 ml  Output    550 ml  Net   -310 ml    Exam  General: Well developed, well nourished, NAD, appears stated age  HEENT: NCAT, mucous membranes moist.   Cardiovascular: S1 S2 auscultated, no rubs, murmurs or gallops. Regular rate and rhythm.  Respiratory: +exp wheezing, rhonchi  Abdomen: Soft, nontender, nondistended, + bowel sounds  Extremities: warm dry without cyanosis clubbing or edema  Psych: Pleasant, appropriate mood and affect  Data Review   Micro Results Recent Results (from the past 240 hour(s))  MRSA PCR Screening     Status: None   Collection Time: 09/30/14 11:59 PM  Result Value Ref Range Status   MRSA by PCR NEGATIVE NEGATIVE Final    Comment:        The GeneXpert MRSA Assay (FDA approved for NASAL specimens only), is one component of a comprehensive MRSA colonization surveillance program. It is not intended to diagnose MRSA infection nor to guide or monitor treatment for MRSA infections.     Radiology Reports Dg Chest 2 View  09/30/2014   CLINICAL DATA:  Chest pain  EXAM: CHEST  2 VIEW  COMPARISON:  09/06/2014, 09/09/2014  FINDINGS: Cardiac shadow is within normal limits. The lungs are well aerated bilaterally. Right apical mass lesion and right hilar mass lesion are again seen and stable. No new focal infiltrate or sizable effusion is noted. No bony abnormality is seen.  IMPRESSION: Persistent right hilar  and right apical mass lesion. The overall appearance is stable from the prior study.   Electronically Signed   By: Inez Catalina M.D.   On: 09/30/2014 17:18   Ct Soft Tissue Neck W Contrast  09/09/2014   CLINICAL DATA:  Abnormal chest x-ray. Throat tightness for 3-4 days. Choking sensation.  EXAM: CT NECK AND CHEST WITH CONTRAST  TECHNIQUE: Multidetector CT imaging of the neck and chest was performed using the standard protocol after bolus  administration of intravenous contrast.  CONTRAST:  132m OMNIPAQUE IOHEXOL 300 MG/ML  SOLN  COMPARISON:  Chest x-ray 09/06/2014.  Chest CT 06/01/2012.  FINDINGS: CT NECK FINDINGS  Airways patent. Epiglottis is normal. Parotid glands and submandibular glands are normal.  Approximate 4 cm nodule within the right thyroid lobe. Slight mass-effect on the trachea with right to left midline shift.  No cervical adenopathy. Vascular structures are unremarkable. Visualized paranasal sinuses and mastoids are clear.  Degenerative changes in the cervical spine. No acute bony abnormality.  CT CHEST FINDINGS  CT CHEST FINDINGS  Mediastinum/Nodes: Right hilar adenopathy measuring 3.3 x 2.9 cm on image 23. Mediastinal adenopathy which has a somewhat necrotic low-density appearance. 11 mm short axis right paratracheal lymph node on image 21. No axillary adenopathy.  Lungs/Pleura: 2 cm x 2 cm nodule in the right apex corresponding to the density seen on prior chest x-ray. Adjacent airspace disease likely reflects postobstructive atelectasis although tumor extension cannot be excluded. Reticulonodular densities noted in the inferior right lower lobe, likely related to postobstructive process from the right hilar mass/ adenopathy. No pulmonary nodules on the left. No effusions.  Musculoskeletal: Chest wall soft tissues are unremarkable. No acute bony abnormality or focal bone lesion.  Upper abdomen: Gallbladder is contracted with calcifications near the fundus. These may be within the gallbladder  wall. 2.2 cm nodule in the left adrenal gland. Cysts noted in the upper poles of the kidneys bilaterally.  IMPRESSION: 2 cm right apical nodule concerning for lung cancer. Adjacent airspace disease anteriorly likely reflects postobstructive atelectasis although tumor extension cannot be excluded.  Large right hilar mass or adenopathy. Adjacent mediastinal adenopathy. Reticulonodular densities in the inferior right upper lobe likely reflective of postobstructive process. Again, tumor cannot be excluded.  Left adrenal nodule, possibly representing metastasis.  4 cm right thyroid mass.   Electronically Signed   By: KRolm BaptiseM.D.   On: 09/09/2014 14:20   Ct Chest W Contrast  09/09/2014   CLINICAL DATA:  Abnormal chest x-ray. Throat tightness for 3-4 days. Choking sensation.  EXAM: CT NECK AND CHEST WITH CONTRAST  TECHNIQUE: Multidetector CT imaging of the neck and chest was performed using the standard protocol after bolus administration of intravenous contrast.  CONTRAST:  1059mOMNIPAQUE IOHEXOL 300 MG/ML  SOLN  COMPARISON:  Chest x-ray 09/06/2014.  Chest CT 06/01/2012.  FINDINGS: CT NECK FINDINGS  Airways patent. Epiglottis is normal. Parotid glands and submandibular glands are normal.  Approximate 4 cm nodule within the right thyroid lobe. Slight mass-effect on the trachea with right to left midline shift.  No cervical adenopathy. Vascular structures are unremarkable. Visualized paranasal sinuses and mastoids are clear.  Degenerative changes in the cervical spine. No acute bony abnormality.  CT CHEST FINDINGS  CT CHEST FINDINGS  Mediastinum/Nodes: Right hilar adenopathy measuring 3.3 x 2.9 cm on image 23. Mediastinal adenopathy which has a somewhat necrotic low-density appearance. 11 mm short axis right paratracheal lymph node on image 21. No axillary adenopathy.  Lungs/Pleura: 2 cm x 2 cm nodule in the right apex corresponding to the density seen on prior chest x-ray. Adjacent airspace disease likely reflects  postobstructive atelectasis although tumor extension cannot be excluded. Reticulonodular densities noted in the inferior right lower lobe, likely related to postobstructive process from the right hilar mass/ adenopathy. No pulmonary nodules on the left. No effusions.  Musculoskeletal: Chest wall soft tissues are unremarkable. No acute bony abnormality or focal bone lesion.  Upper abdomen: Gallbladder is contracted with calcifications near  the fundus. These may be within the gallbladder wall. 2.2 cm nodule in the left adrenal gland. Cysts noted in the upper poles of the kidneys bilaterally.  IMPRESSION: 2 cm right apical nodule concerning for lung cancer. Adjacent airspace disease anteriorly likely reflects postobstructive atelectasis although tumor extension cannot be excluded.  Large right hilar mass or adenopathy. Adjacent mediastinal adenopathy. Reticulonodular densities in the inferior right upper lobe likely reflective of postobstructive process. Again, tumor cannot be excluded.  Left adrenal nodule, possibly representing metastasis.  4 cm right thyroid mass.   Electronically Signed   By: Rolm Baptise M.D.   On: 09/09/2014 14:20   Ct Angio Chest Pe W/cm &/or Wo Cm  09/30/2014   CLINICAL DATA:  Right-sided chest pain since this morning.  EXAM: CT ANGIOGRAPHY CHEST WITH CONTRAST  TECHNIQUE: Multidetector CT imaging of the chest was performed using the standard protocol during bolus administration of intravenous contrast. Multiplanar CT image reconstructions and MIPs were obtained to evaluate the vascular anatomy.  CONTRAST:  179m OMNIPAQUE IOHEXOL 350 MG/ML SOLN  COMPARISON:  CT scan of the chest dated 09/09/2014 and chest x-ray dated 09/30/2014  FINDINGS: There has been marked progression of the articular mass in the right lung apex, now all less well-defined and now measuring 5.9 x 2.8 cm, increased from 4.2 by 1.6 cm.  There is increased right hilar adenopathy now measuring 3.9 x 3.4 cm, increased from 3.3  x 2.9 cm. Precarinal lymph node is increased from 12 mm to 14 mm. Right hilar adenopathy has increased inferiorly.  There is marked enlargement of the right lobe of the thyroid gland, measuring 4.8 x 3.4 cm, increased from 4.4 x 3.2 cm.  New tiny right effusion.  New  cardiomegaly.  2.1 cm nodule in the left adrenal gland is relatively dense and could represent metastatic disease.  Multiple stones in the gallbladder.  Review of the MIP images confirms the above findings.  IMPRESSION: 1. Marked increase in the right apical lung mass with new hazy density in the surrounding lung. Increased hilar and mediastinal adenopathy. 2. New cardiomegaly. 3. Interval enlargement of the right lobe of the thyroid gland. 4. New tiny right effusion.   Electronically Signed   By: JLorriane ShireM.D.   On: 09/30/2014 19:46    CBC  Recent Labs Lab 09/30/14 1613 09/30/14 2340  WBC 9.6 10.0  HGB 11.2* 11.4*  HCT 35.0* 34.8*  PLT 255 267  MCV 85.8 85.1  MCH 27.5 27.9  MCHC 32.0 32.8  RDW 13.5 13.5  LYMPHSABS 1.7  --   MONOABS 1.0  --   EOSABS 0.2  --   BASOSABS 0.0  --     Chemistries   Recent Labs Lab 09/30/14 1613 09/30/14 2340  NA 138  --   K 3.4*  --   CL 100  --   CO2 28  --   GLUCOSE 164*  --   BUN 15  --   CREATININE 0.91 0.80  CALCIUM 9.0  --   MG  --  1.7  AST 16  --   ALT 12  --   ALKPHOS 43  --   BILITOT 0.8  --    ------------------------------------------------------------------------------------------------------------------ estimated creatinine clearance is 84.6 mL/min (by C-G formula based on Cr of 0.8). ------------------------------------------------------------------------------------------------------------------ No results for input(s): HGBA1C in the last 72 hours. ------------------------------------------------------------------------------------------------------------------ No results for input(s): CHOL, HDL, LDLCALC, TRIG, CHOLHDL, LDLDIRECT in the last 72  hours. ------------------------------------------------------------------------------------------------------------------  Recent Labs  09/30/14 2340  TSH 1.071   ------------------------------------------------------------------------------------------------------------------ No results for input(s): VITAMINB12, FOLATE, FERRITIN, TIBC, IRON, RETICCTPCT in the last 72 hours.  Coagulation profile No results for input(s): INR, PROTIME in the last 168 hours.   Recent Labs  09/30/14 1613  DDIMER 1.25*    Cardiac Enzymes  Recent Labs Lab 09/30/14 1939 09/30/14 2340 10/01/14 1108  TROPONINI <0.03 <0.03 <0.03   ------------------------------------------------------------------------------------------------------------------ Invalid input(s): POCBNP    Yerlin Gasparyan D.O. on 10/01/2014 at 1:43 PM  Between 7am to 7pm - Pager - [phone]  After 7pm go to [email or web address] - password [email or web address]  And look for the night coverage person covering for me after hours  [facility] Office  [phone]

## 2014-10-02 ENCOUNTER — Telehealth: Payer: Self-pay | Admitting: Internal Medicine

## 2014-10-02 DIAGNOSIS — R918 Other nonspecific abnormal finding of lung field: Secondary | ICD-10-CM

## 2014-10-02 LAB — BASIC METABOLIC PANEL
Anion gap: 9 (ref 5–15)
BUN: 13 mg/dL (ref 6–23)
CALCIUM: 9.1 mg/dL (ref 8.4–10.5)
CHLORIDE: 101 mmol/L (ref 96–112)
CO2: 29 mmol/L (ref 19–32)
Creatinine, Ser: 0.82 mg/dL (ref 0.50–1.35)
GFR calc non Af Amer: 85 mL/min — ABNORMAL LOW (ref >90)
Glucose, Bld: 117 mg/dL — ABNORMAL HIGH (ref 70–99)
Potassium: 3.4 mmol/L — ABNORMAL LOW (ref 3.5–5.1)
SODIUM: 139 mmol/L (ref 135–145)

## 2014-10-02 LAB — T3, FREE: T3 FREE: 2.4 pg/mL (ref 2.0–4.4)

## 2014-10-02 LAB — GLUCOSE, CAPILLARY
Glucose-Capillary: 103 mg/dL — ABNORMAL HIGH (ref 70–99)
Glucose-Capillary: 94 mg/dL (ref 70–99)

## 2014-10-02 LAB — T4, FREE: FREE T4: 1.02 ng/dL (ref 0.80–1.80)

## 2014-10-02 MED ORDER — POTASSIUM CHLORIDE CRYS ER 20 MEQ PO TBCR
40.0000 meq | EXTENDED_RELEASE_TABLET | Freq: Once | ORAL | Status: AC
  Administered 2014-10-02: 40 meq via ORAL
  Filled 2014-10-02: qty 2

## 2014-10-02 NOTE — Progress Notes (Addendum)
He has a lung mass. Apparently he was not felt to be a candidate for needle biopsy. I am currently only doing emergency bronchoscopies here because of the remodeling of our operating room and no negative pressure room. Discussed with Dr.Mikhail. I agree he needs to have some sort of biopsy soon because the CT looks like the mass in his lung and the mass in his thyroid is increasing in size fairly rapidly. PET scan could be helpful to see if he has something that is easier to  biopsy.

## 2014-10-02 NOTE — Telephone Encounter (Signed)
Alejandro [name] has a lung mass. Apparently growing fairly rapid per the hospitalista at [facility]. Can you work him in soon with PET scan and see me < 10 days? If not, let me know  Thanks  Dr. Brand Males, M.D., [facility].C.P Pulmonary and Critical Care Medicine Staff Physician [facility] Pager: [phone], If no answer or between  15:00h - 7:00h: call [phone]  10/02/2014 8:35 AM

## 2014-10-02 NOTE — Progress Notes (Signed)
Patient ID: Alejandro [name], male   DOB: May 28, 1941, 74 y.o.   MRN: [id number] A CT guided lung biopsy has been requested.  I have reviewed the recent CT images.  There is obstruction at the origin of the right upper lobe bronchus with an indeterminate nodular lesion at right lung apex.  There is increased parenchymal disease in the right upper lobe which likely represents post-obstructive pneumonitis.  Ideally, this patient needs a PET-CT.  I would recommend bronchoscopy for further evaluation of the RUL obstruction and tissue sampling.  CT guided lung biopsy is going to be difficult due to the location and surrounding pneumonitis.  Discussed with Dr. Ree Kida.

## 2014-10-02 NOTE — Discharge Summary (Signed)
Physician Discharge Summary  Alejandro [name] YPP:[id number] DOB: [birth year] DOA: 09/30/2014  PCP: [name], PA-C  Admit date: 09/30/2014 Discharge date: 10/02/2014  Time spent: 45 minutes  Recommendations for Outpatient Follow-up:  Patient will be discharged to home.  Patient will need to follow up with primary care provider within one week of discharge.  Patient will be contacted by Dr. Donald Pore office as well as pulmonary/critical care in [place] for outpatient appointments and follow up.   Patient should continue medications as prescribed.  Patient should follow a heart healthy/carb modified diet.   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  Filed Weights   09/30/14 1544 09/30/14 2142  Weight: 88.451 kg (195 lb) 88.769 kg (195 lb 11.2 oz)    History of present illness:  on 09/30/2014 by Dr. Gean Birchwood Alejandro [name] is a 74 y.o. male history of diabetes mellitus type 2, hypertension, ongoing tobacco abuse, schizophrenia was brought to the ER from group home after patient had chest pain. Patient had chest pain since last night. Patient chest pain is mostly on the right side on deep inspiration. Stabbing in nature. Patient also has been having some productive cough denies any fever or chills. The ER patient had elevated d-dimer and had undergone CT angiogram of the chest which shows right-sided lung mass which is enlarging. EKG and cardiac markers were unremarkable. Patient has been admitted for further management. Patient denies any shortness of breath palpitations diaphoresis. Denies any abdominal pain nausea vomiting or diarrhea.   Hospital Course:  Atypical chest pain -Mostly located on the right side, worse with  inspiration -Likely secondary to lung mass -Troponins negative -Echocardiogram: EF 45-80%, grade 1 diastolic dysfunction -CTA: marked increase in right apical lung mass with new hazy density  -Continue metoprolol, aspirin, lisinopril, statin  Combined Diastolic/Systolic CHF -Currently not in exacerbation, appears to be euvolemic -Echocardiogram: EF 99-83%, grade 1 diastolic dysfunction -BNP 36 -Monitored daily weights, intake and output  Right lung mass/Thyroid gland nodule -Increasing in size as compared to previous CT 09/09/2014 -Pulmonology consulted and appreciated, and agreed with lung biopsy -Unable to perform bronchoscopy at [facility] due to remodeling -Oncology consulted and appreciated, Dr. Whitney Muse will schedule an outpatient appointment with the patient within the next 1-2 days -IR consulted for possible biopsy, however spoke with Dr. Anselm Pancoast who felt that patient should have bronchoscopy and PET first as biopsy would be difficult due to inflammation and possible pneumonitis  -Spoke with P[facility]), will try to schedule outpatient appt   Normocytic Anemia -Hemoglobin stable 11.4 (was 13 in 2013) -Continue to monitor CBC  Diabetes Mellitus, Type 2 -Metformin held but may be restarted on discharge  -Placed on ISS and CBG monitoring during hospitalization  Essential hypertension -Stable. Continue norvasc, metoprolol, lisinopril, HCTZ  History of schizophrenia  -Patient resides at a group home -Continue risperdal  Procedures: None  Consultations: Oncology, Dr. Larey Seat phone Pulmonology, Dr. Luan Pulling, Dr. Lynford Citizen Interventional radiology, Dr. Anselm Pancoast  Discharge Exam: Filed Vitals:   10/02/14 0703  BP: 107/80  Pulse: 72  Temp: 98.8 F (37.1 C)  Resp: 18   Exam  General: Well developed, well nourished, no distress  HEENT: NCAT, mucous membranes moist.   Cardiovascular: S1 S2 auscultated,RRR, no murmurs  Respiratory: Diminished breath sounds on  right, otherwise, no wheezing, crackles, rales  Abdomen: Soft, nontender, nondistended, + bowel sounds  Extremities: warm dry without cyanosis clubbing or edema  Psych: Pleasant and appropriate  Discharge Instructions      Discharge Instructions    Discharge instructions    Complete by:  As directed   Patient will be discharged to home.  Patient will need to follow up with primary care provider within one week of discharge.  Patient will be contacted by Dr. Donald Pore office as well as pulmonary/critical care in [place] for outpatient appointments and follow up.   Patient should continue medications as prescribed.  Patient should follow a heart healthy/carb modified diet.            Medication List    TAKE these medications        ASPIR-LOW 81 MG EC tablet  Generic drug:  aspirin  Take 81 mg by mouth daily.     AVODART 0.5 MG capsule  Generic drug:  dutasteride  Take 0.5 mg by mouth daily.     econazole nitrate 1 % cream  Apply 1 application topically 2 (two) times daily.     fish oil-omega-3 fatty acids 1000 MG capsule  Take 1 g by mouth 2 (two) times daily.     FLOMAX 0.4 MG Caps capsule  Generic drug:  tamsulosin  Take 0.4 mg by mouth daily.     fluticasone 50 MCG/ACT nasal spray  Commonly known as:  FLONASE  Place 1 spray into the nose daily.     lisinopril-hydrochlorothiazide 20-12.5 MG per tablet  Commonly known as:  PRINZIDE,ZESTORETIC  Take 1 tablet by mouth daily.     lovastatin 40 MG tablet  Commonly known as:  MEVACOR  Take 40 mg by mouth at bedtime.     meloxicam 15 MG tablet  Commonly known as:  MOBIC  Take 15 mg by mouth daily as needed for pain.     metFORMIN 500 MG tablet  Commonly known as:  GLUCOPHAGE  Take 500 mg by mouth 2 (two) times daily with a meal.     multivitamin with minerals tablet  Take 1 tablet by mouth daily.     NORVASC 5 MG tablet  Generic drug:  amLODipine  Take 5 mg by mouth daily.     omeprazole 20 MG capsule    Commonly known as:  PRILOSEC  Take 20 mg by mouth every morning. 1 po every morning     polyethylene glycol packet  Commonly known as:  MIRALAX / GLYCOLAX  Take 17 g by mouth See admin instructions. Mix 1 capful (17 gm) in 8 oz of water twice daily until regular bowel movements occur, then start once daily as needed for constipation.     PROAIR HFA 108 (90 BASE) MCG/ACT inhaler  Generic drug:  albuterol  Inhale 2 puffs into the lungs every 4 (four) hours as needed. FOR SHORTNESS OF BREATH     RISPERDAL 3 MG tablet  Generic drug:  risperiDONE  Take 3 mg by mouth 2 (two) times daily.     TOPROL XL 25 MG 24 hr tablet  Generic drug:  metoprolol succinate  Take 25 mg by mouth daily.     traZODone 150 MG tablet  Commonly known as:  DESYREL  Take 150 mg by mouth at bedtime.     TYLENOL 325 MG tablet  Generic drug:  acetaminophen  Take 650 mg by mouth every 6 (six) hours as needed. Pain.     Vitamin D 2000 UNITS tablet  Take  2,000 Units by mouth daily.       No Known Allergies Follow-up Information    Follow up with ROBERTSON, ANTHONY T, PA-C. Schedule an appointment as soon as possible for a visit in 1 week.   Specialty:  Physician Assistant   Why:  Hospital follow up   Contact information:   [address] [place] [address] [phone]       Follow up with [facility], MD. Call in 1 week.   Specialty:  Pulmonary Disease   Why:  Hospital follow up   Contact information:   [place] [address] [phone]       Follow up with Molli Hazard, MD. Schedule an appointment as soon as possible for a visit in 1 day.   Specialties:  Hematology and Oncology, Oncology   Why:  Hospital follow up   Contact information:   [place] [address] [phone]        The results of significant diagnostics from this hospitalization (including imaging, microbiology, ancillary and laboratory) are listed below for reference.     Significant Diagnostic Studies: Dg Chest 2 View  09/30/2014   CLINICAL DATA:  Chest pain  EXAM: CHEST  2 VIEW  COMPARISON:  09/06/2014, 09/09/2014  FINDINGS: Cardiac shadow is within normal limits. The lungs are well aerated bilaterally. Right apical mass lesion and right hilar mass lesion are again seen and stable. No new focal infiltrate or sizable effusion is noted. No bony abnormality is seen.  IMPRESSION: Persistent right hilar and right apical mass lesion. The overall appearance is stable from the prior study.   Electronically Signed   By: Inez Catalina M.D.   On: 09/30/2014 17:18   Ct Soft Tissue Neck W Contrast  09/09/2014   CLINICAL DATA:  Abnormal chest x-ray. Throat tightness for 3-4 days. Choking sensation.  EXAM: CT NECK AND CHEST WITH CONTRAST  TECHNIQUE: Multidetector CT imaging of the neck and chest was performed using the standard protocol after bolus administration of intravenous contrast.  CONTRAST:  153m OMNIPAQUE IOHEXOL 300 MG/ML  SOLN  COMPARISON:  Chest x-ray 09/06/2014.  Chest CT 06/01/2012.  FINDINGS: CT NECK FINDINGS  Airways patent. Epiglottis is normal. Parotid glands and submandibular glands are normal.  Approximate 4 cm nodule within the right thyroid lobe. Slight mass-effect on the trachea with right to left midline shift.  No cervical adenopathy. Vascular structures are unremarkable. Visualized paranasal sinuses and mastoids are clear.  Degenerative changes in the cervical spine. No acute bony abnormality.  CT CHEST FINDINGS  CT CHEST FINDINGS  Mediastinum/Nodes: Right hilar adenopathy measuring 3.3 x 2.9 cm on image 23. Mediastinal adenopathy which has a somewhat necrotic low-density appearance. 11 mm short axis right paratracheal lymph node on image 21. No axillary adenopathy.  Lungs/Pleura: 2 cm x 2 cm nodule in the right apex corresponding to the density seen on prior chest x-ray. Adjacent airspace disease likely reflects postobstructive atelectasis although tumor  extension cannot be excluded. Reticulonodular densities noted in the inferior right lower lobe, likely related to postobstructive process from the right hilar mass/ adenopathy. No pulmonary nodules on the left. No effusions.  Musculoskeletal: Chest wall soft tissues are unremarkable. No acute bony abnormality or focal bone lesion.  Upper abdomen: Gallbladder is contracted with calcifications near the fundus. These may be within the gallbladder wall. 2.2 cm nodule in the left adrenal gland. Cysts noted in the upper poles of the kidneys bilaterally.  IMPRESSION: 2 cm  right apical nodule concerning for lung cancer. Adjacent airspace disease anteriorly likely reflects postobstructive atelectasis although tumor extension cannot be excluded.  Large right hilar mass or adenopathy. Adjacent mediastinal adenopathy. Reticulonodular densities in the inferior right upper lobe likely reflective of postobstructive process. Again, tumor cannot be excluded.  Left adrenal nodule, possibly representing metastasis.  4 cm right thyroid mass.   Electronically Signed   By: Rolm Baptise M.D.   On: 09/09/2014 14:20   Ct Chest W Contrast  09/09/2014   CLINICAL DATA:  Abnormal chest x-ray. Throat tightness for 3-4 days. Choking sensation.  EXAM: CT NECK AND CHEST WITH CONTRAST  TECHNIQUE: Multidetector CT imaging of the neck and chest was performed using the standard protocol after bolus administration of intravenous contrast.  CONTRAST:  164m OMNIPAQUE IOHEXOL 300 MG/ML  SOLN  COMPARISON:  Chest x-ray 09/06/2014.  Chest CT 06/01/2012.  FINDINGS: CT NECK FINDINGS  Airways patent. Epiglottis is normal. Parotid glands and submandibular glands are normal.  Approximate 4 cm nodule within the right thyroid lobe. Slight mass-effect on the trachea with right to left midline shift.  No cervical adenopathy. Vascular structures are unremarkable. Visualized paranasal sinuses and mastoids are clear.  Degenerative changes in the cervical spine. No  acute bony abnormality.  CT CHEST FINDINGS  CT CHEST FINDINGS  Mediastinum/Nodes: Right hilar adenopathy measuring 3.3 x 2.9 cm on image 23. Mediastinal adenopathy which has a somewhat necrotic low-density appearance. 11 mm short axis right paratracheal lymph node on image 21. No axillary adenopathy.  Lungs/Pleura: 2 cm x 2 cm nodule in the right apex corresponding to the density seen on prior chest x-ray. Adjacent airspace disease likely reflects postobstructive atelectasis although tumor extension cannot be excluded. Reticulonodular densities noted in the inferior right lower lobe, likely related to postobstructive process from the right hilar mass/ adenopathy. No pulmonary nodules on the left. No effusions.  Musculoskeletal: Chest wall soft tissues are unremarkable. No acute bony abnormality or focal bone lesion.  Upper abdomen: Gallbladder is contracted with calcifications near the fundus. These may be within the gallbladder wall. 2.2 cm nodule in the left adrenal gland. Cysts noted in the upper poles of the kidneys bilaterally.  IMPRESSION: 2 cm right apical nodule concerning for lung cancer. Adjacent airspace disease anteriorly likely reflects postobstructive atelectasis although tumor extension cannot be excluded.  Large right hilar mass or adenopathy. Adjacent mediastinal adenopathy. Reticulonodular densities in the inferior right upper lobe likely reflective of postobstructive process. Again, tumor cannot be excluded.  Left adrenal nodule, possibly representing metastasis.  4 cm right thyroid mass.   Electronically Signed   By: KRolm BaptiseM.D.   On: 09/09/2014 14:20   Ct Angio Chest Pe W/cm &/or Wo Cm  09/30/2014   CLINICAL DATA:  Right-sided chest pain since this morning.  EXAM: CT ANGIOGRAPHY CHEST WITH CONTRAST  TECHNIQUE: Multidetector CT imaging of the chest was performed using the standard protocol during bolus administration of intravenous contrast. Multiplanar CT image reconstructions and MIPs  were obtained to evaluate the vascular anatomy.  CONTRAST:  1065mOMNIPAQUE IOHEXOL 350 MG/ML SOLN  COMPARISON:  CT scan of the chest dated 09/09/2014 and chest x-ray dated 09/30/2014  FINDINGS: There has been marked progression of the articular mass in the right lung apex, now all less well-defined and now measuring 5.9 x 2.8 cm, increased from 4.2 by 1.6 cm.  There is increased right hilar adenopathy now measuring 3.9 x 3.4 cm, increased from 3.3 x 2.9 cm. Precarinal lymph node  is increased from 12 mm to 14 mm. Right hilar adenopathy has increased inferiorly.  There is marked enlargement of the right lobe of the thyroid gland, measuring 4.8 x 3.4 cm, increased from 4.4 x 3.2 cm.  New tiny right effusion.  New  cardiomegaly.  2.1 cm nodule in the left adrenal gland is relatively dense and could represent metastatic disease.  Multiple stones in the gallbladder.  Review of the MIP images confirms the above findings.  IMPRESSION: 1. Marked increase in the right apical lung mass with new hazy density in the surrounding lung. Increased hilar and mediastinal adenopathy. 2. New cardiomegaly. 3. Interval enlargement of the right lobe of the thyroid gland. 4. New tiny right effusion.   Electronically Signed   By: Lorriane Shire M.D.   On: 09/30/2014 19:46    Microbiology: Recent Results (from the past 240 hour(s))  MRSA PCR Screening     Status: None   Collection Time: 09/30/14 11:59 PM  Result Value Ref Range Status   MRSA by PCR NEGATIVE NEGATIVE Final    Comment:        The GeneXpert MRSA Assay (FDA approved for NASAL specimens only), is one component of a comprehensive MRSA colonization surveillance program. It is not intended to diagnose MRSA infection nor to guide or monitor treatment for MRSA infections.      Labs: Basic Metabolic Panel:  Recent Labs Lab 09/30/14 1613 09/30/14 2340 10/02/14 0615  NA 138  --  139  K 3.4*  --  3.4*  CL 100  --  101  CO2 28  --  29  GLUCOSE 164*  --   117*  BUN 15  --  13  CREATININE 0.91 0.80 0.82  CALCIUM 9.0  --  9.1  MG  --  1.7  --    Liver Function Tests:  Recent Labs Lab 09/30/14 1613  AST 16  ALT 12  ALKPHOS 43  BILITOT 0.8  PROT 7.2  ALBUMIN 3.2*    Recent Labs Lab 09/30/14 1613  LIPASE 22   No results for input(s): AMMONIA in the last 168 hours. CBC:  Recent Labs Lab 09/30/14 1613 09/30/14 2340  WBC 9.6 10.0  NEUTROABS 6.8  --   HGB 11.2* 11.4*  HCT 35.0* 34.8*  MCV 85.8 85.1  PLT 255 267   Cardiac Enzymes:  Recent Labs Lab 09/30/14 1613 09/30/14 1939 09/30/14 2340 10/01/14 1108  TROPONINI <0.03 <0.03 <0.03 <0.03   BNP: BNP (last 3 results)  Recent Labs  09/30/14 1614  BNP 36.0    ProBNP (last 3 results) No results for input(s): PROBNP in the last 8760 hours.  CBG:  Recent Labs Lab 10/01/14 0918 10/01/14 1143 10/01/14 1652 10/01/14 2132 10/02/14 0743  GLUCAP 93 113* 95 116* 103*       Signed:  Nasiir Monts  [facility] 10/02/2014, 10:40 AM

## 2014-10-02 NOTE — Discharge Instructions (Signed)
Pulmonary Nodule A pulmonary nodule is a small, round growth of tissue in the lung. Pulmonary nodules can range in size from less than 1/5 inch (4 mm) to a little bigger than an inch (25 mm). Most pulmonary nodules are detected when imaging tests of the lung are being performed for a different problem. Pulmonary nodules are usually not cancerous (benign). However, some pulmonary nodules are cancerous (malignant). Follow-up treatment or testing is based on the size of the pulmonary nodule and your risk of getting lung cancer.  CAUSES Benign pulmonary nodules can be caused by various things. Some of the causes include:   Bacterial, fungal, or viral infections. This is usually an old infection that is no longer active, but it can sometimes be a current, active infection.  A benign mass of tissue.  Inflammation from conditions such as rheumatoid arthritis.   Abnormal blood vessels in the lungs. Malignant pulmonary nodules can result from lung cancer or from cancers that spread to the lung from other places in the body. SIGNS AND SYMPTOMS Pulmonary nodules usually do not cause symptoms. DIAGNOSIS Most often, pulmonary nodules are found incidentally when an X-ray or CT scan is performed to look for some other problem in the lung area. To help determine whether a pulmonary nodule is benign or malignant, your health care provider will take a medical history and order a variety of tests. Tests done may include:   Blood tests.  A skin test called a tuberculin test. This test is used to determine if you have been exposed to the germ that causes tuberculosis.   Chest X-rays. If possible, a new X-ray may be compared with X-rays you have had in the past.   CT scan. This test shows smaller pulmonary nodules more clearly than an X-ray.   Positron emission tomography (PET) scan. In this test, a safe amount of a radioactive substance is injected into the bloodstream. Then, the scan takes a picture of  the pulmonary nodule. The radioactive substance is eliminated from your body in your urine.   Biopsy. A tiny piece of the pulmonary nodule is removed so it can be checked under a microscope. TREATMENT  Pulmonary nodules that are benign normally do not require any treatment because they usually do not cause symptoms or breathing problems. Your health care provider may want to monitor the pulmonary nodule through follow-up CT scans. The frequency of these CT scans will vary based on the size of the nodule and the risk factors for lung cancer. For example, CT scans will need to be done more frequently if the pulmonary nodule is larger and if you have a history of smoking and a family history of cancer. Further testing or biopsies may be done if any follow-up CT scan shows that the size of the pulmonary nodule has increased. HOME CARE INSTRUCTIONS  Only take over-the-counter or prescription medicines as directed by your health care provider.  Keep all follow-up appointments with your health care provider. SEEK MEDICAL CARE IF:  You have trouble breathing when you are active.   You feel sick or unusually tired.   You do not feel like eating.   You lose weight without trying to.   You develop chills or night sweats.  SEEK IMMEDIATE MEDICAL CARE IF:  You cannot catch your breath, or you begin wheezing.   You cannot stop coughing.   You cough up blood.   You become dizzy or feel like you are going to pass out.   You   have sudden chest pain.   You have a fever or persistent symptoms for more than 2-3 days.   You have a fever and your symptoms suddenly get worse. MAKE SURE YOU:  Understand these instructions.  Will watch your condition.  Will get help right away if you are not doing well or get worse. Document Released: 03/24/2009 Document Revised: 01/27/2013 Document Reviewed: 11/16/2012 Exit[employer] Patient Information 2015 [employer]. This information is not intended  to replace advice given to you by your health care provider. Make sure you discuss any questions you have with your health care provider.  

## 2014-10-02 NOTE — Progress Notes (Signed)
NURSING PROGRESS NOTE  Alejandro [name] [id number] Discharge Data: 10/02/2014 4:34 PM Attending Provider: No att. providers found MBT:DHRCBULAG, Thomasena Edis, PA-C   [name] to be D/C'd Nursing Home per MD order.    All IV's discontinued and monitored for bleeding.  All belongings returned to patient for patient to take home.  AVS summary and prescriptions explained to patient and patient's caregiver, [name].   Patient left floor via wheelchair, escorted by NT.  Last Documented Vital Signs:  Blood pressure 107/80, pulse 72, temperature 98.8 F (37.1 C), temperature source Oral, resp. rate 18, height '5\' 6"'$  (1.676 m), weight 88.769 kg (195 lb 11.2 oz), SpO2 97 %.  Cecilie Kicks D

## 2014-10-03 NOTE — Telephone Encounter (Signed)
Can you get  aPET scan before 10/10/14?

## 2014-10-03 NOTE — Telephone Encounter (Signed)
PCC's please advise if a PET can be scheduled before appt with TP on 5/2. Thanks.

## 2014-10-03 NOTE — Progress Notes (Addendum)
[facility] Hematology/Oncology Consultation   Name: Alejandro [name]      MRN: [id number]    Location: Room/bed info not found  Date: 10/06/2014 Time:3:51 PM   REFERRING PHYSICIAN:  Velta Addison Mikhail, DO  REASON FOR CONSULT:  Right apical lung mass with hilar and mediastinal lymphdenopathy   DIAGNOSIS:  Un-biopsied lung mass, right apical, with hilar and mediastinal lymphadenopathy, suspicious for primary bronchogenic lung cancer; with small cell lung cancer on the differential given quick interval enlargement  HISTORY OF PRESENT ILLNESS:   Alejandro [name] is a 74 year old black American who was recently admitted to the [facility] on 09/30/2014 for chest pain with a work-up that revealed a quick growing right apical mass with history of outpatient follow-up regarding initial finding of right apical mass.    I personally reviewed and went over laboratory results with the patient.  The results are noted within this dictation.  I personally reviewed and went over radiographic studies with the patient.  The results are noted within this dictation.    Chart is reviewed.   On 09/09/2014, the patient underwent a CT of chest demonstrating a 2 cm right apical nodule concerning for lung cancer with large right hilar mass and/or adenopathy, adjacent mediastinal adenopathy, 4 cm right thyroid mass, and left adrenal nodule (possible metastasis).  This test was ordered by Karna Dupes, PA-C.    Caregivers report that they were aware of the results and they were awaiting to learn of biopsy appointment.  Three weeks after that aforementioned Ct scan, Alejandro [name] re-presented to the [facility] ED on 09/30/2014 with chest pain.  Work-up including a CT angio of chest to evaluate for PE.  It was negative for PE, but demonstrated marked increase in the right apical lung mass, increased hilar and mediastinal adenopathy, interval enlargement of the right lobe of thyroid gland, and new tiny right  effusion.  Over the hospitalization weekend, Oncology was consulted.  We set him up for a follow-up appointment at [facility] as Alejandro [name] was stable for discharge.  Alejandro [name] was subsequently seen today.  Alejandro [name] lives in an adult group home with a past medical history significant for tobacco abuse, schizophrenia, distant history of cocaine abuse, and 8- 9th grade education.  Alejandro [name] admits dyspnea of exertion, rare instance of hemoptysis, decreased appetite, and an 8 lb weight loss (unintentional) over a 3 month time span.  Alejandro [name] has been living in the group home for approximately 20 years.  Alejandro [name] used to smoke 1 ppd x 40 years (at least) but quit on Thursday because "I just didn't want to smoke anymore."  Alejandro [name] is a little slow with answering questions, but answers appropriately.  Alejandro [name] is capable of making his own medical decision.  Alejandro [name] does not have a healthcare power of attorney at this time.  Alejandro [name] has a brother and sister who live in [place].  Alejandro [name] has no other family Alejandro [name] states.  His mother passed in her late 4's from a stroke and his father passed away in his late 86'H from complications associated with diabetes.  Alejandro [name] denies a family history of malignancy.  Otherwise, oncologically, Alejandro [name] denies any complaints and ROS questioning is negative.  PAST MEDICAL HISTORY:   Past Medical History  Diagnosis Date  . Mixed hyperlipidemia   . Essential hypertension, benign   . Schizophrenia   . Positive PPD     Treated  . DM2 (diabetes mellitus, type 2)   .  Diverticulosis     Pancolonic   . Hemorrhoids   . PVC's (premature ventricular contractions)   . Syncope   . Mentally challenged   . History of GI diverticular bleed   . Lung mass     ALLERGIES: No Known Allergies    MEDICATIONS: I have reviewed the patient's current medications.     PAST SURGICAL HISTORY Past Surgical History  Procedure Laterality Date  . Colonoscopy   07/11/2004     Rehman-Pancolonic diverticulosis/Small external hemorrhoids/ The colon was full of  coffee-ground material, but no active bleeding  . Esophagogastroduodenoscopy  07/11/2004    Rehman-incomplete ring GEJ, Small sliding hiatal hernia/Bulbar duodenitis without stigmata of bleeding  . Givens capsule study  01/10/2005    Rehman-Few petechiae noted at involving gastric mucosa as well as duodenum and  jejunum/ the  surface of the small bowel mucosa could not be seen because of food   debris limiting the quality of this study.  . Colonoscopy  03/29/2008    Large mouth ascending colon and sigmoid colon diverticula which were frequent.  Otherwise, no polyps, masses, inflammatory changes or AVM seen. /Small internal hemorrhoids, otherwise normal retroflexed view of the rectum  . Arm surgery      left  . Esophagogastroduodenoscopy (egd) with esophageal dilation  04/17/2012    YBO:[id number] ZENKER'S DIVERTICULUM OR PRIMARY ESOPHGEAL MOILITY DISORDER/MODERATE Erosive gastritis/MILD Duodenal inflammation     FAMILY HISTORY: Family History  Problem Relation Age of Onset  . Stroke Father   . Stroke Mother   . CAD Mother     SOCIAL HISTORY:  reports that Alejandro [name] has been smoking Cigarettes.  Alejandro [name] has a 25 pack-year smoking history. Alejandro [name] does not have any smokeless tobacco history on file. Alejandro [name] reports that Alejandro [name] does not drink alcohol or use illicit drugs.  PERFORMANCE STATUS: The patient's performance status is 0 - Asymptomatic  PHYSICAL EXAM: Most Recent Vital Signs: Blood pressure 111/71, pulse 43, temperature 98 F (36.7 C), temperature source Oral, resp. rate 15, height 5' 7.5" (1.715 m), weight 194 lb 8 oz (88.225 kg), SpO2 100 %. General appearance: alert, cooperative, no distress, moderately obese, slowed mentation and appears younger than stated age Head: Normocephalic, without obvious abnormality, atraumatic Eyes: negative findings: lids and lashes normal, conjunctivae and sclerae normal, corneas clear and pupils equal, round, reactive to light and accomodation Throat: normal findings:  lips normal without lesions, buccal mucosa normal, soft palate, uvula, and tonsils normal and oropharynx pink & moist without lesions or evidence of thrush and abnormal findings: dentition: upper and lower dentures Neck: no adenopathy, supple, symmetrical, trachea midline and thyroid not enlarged, symmetric, no tenderness/mass/nodules Back: symmetric, no curvature. ROM normal. No CVA tenderness. Lungs: clear to auscultation bilaterally and normal percussion bilaterally Heart: regularly irregular rhythm with a 3/1 beat and systolic ejection murmur Abdomen: soft, non-tender; bowel sounds normal; no masses,  no organomegaly Extremities: extremities normal, atraumatic, no cyanosis or edema Pulses: 2+ and symmetric Skin: Skin color, texture, turgor normal. No rashes or lesions Lymph nodes: Cervical, supraclavicular, and axillary nodes normal. Neurologic: Alert and oriented X 3, normal strength and tone. Normal symmetric reflexes. Normal coordination and gait  LABORATORY DATA:  Results for orders placed or performed in visit on 10/06/14 (from the past 48 hour(s))  CBC with Differential     Status: Abnormal   Collection Time: 10/06/14 12:59 PM  Result Value Ref Range   WBC 13.1 (H) 4.0 - 10.5 K/uL   RBC 3.64 (L) 4.22 - 5.81  MIL/uL   Hemoglobin 9.9 (L) 13.0 - 17.0 g/dL   HCT 30.9 (L) 39.0 - 52.0 %   MCV 84.9 78.0 - 100.0 fL   MCH 27.2 26.0 - 34.0 pg   MCHC 32.0 30.0 - 36.0 g/dL   RDW 13.5 11.5 - 15.5 %   Platelets 336 150 - 400 K/uL   Neutrophils Relative % 72 43 - 77 %   Neutro Abs 9.4 (H) 1.7 - 7.7 K/uL   Lymphocytes Relative 14 12 - 46 %   Lymphs Abs 1.8 0.7 - 4.0 K/uL   Monocytes Relative 13 (H) 3 - 12 %   Monocytes Absolute 1.7 (H) 0.1 - 1.0 K/uL   Eosinophils Relative 2 0 - 5 %   Eosinophils Absolute 0.2 0.0 - 0.7 K/uL   Basophils Relative 0 0 - 1 %   Basophils Absolute 0.0 0.0 - 0.1 K/uL  Comprehensive metabolic panel     Status: Abnormal   Collection Time: 10/06/14 12:59 PM    Result Value Ref Range   Sodium 138 135 - 145 mmol/L   Potassium 3.3 (L) 3.5 - 5.1 mmol/L   Chloride 100 96 - 112 mmol/L   CO2 29 19 - 32 mmol/L   Glucose, Bld 83 70 - 99 mg/dL   BUN 14 6 - 23 mg/dL   Creatinine, Ser 0.88 0.50 - 1.35 mg/dL   Calcium 9.4 8.4 - 10.5 mg/dL   Total Protein 7.8 6.0 - 8.3 g/dL   Albumin 3.2 (L) 3.5 - 5.2 g/dL   AST 18 0 - 37 U/L   ALT 19 0 - 53 U/L   Alkaline Phosphatase 55 39 - 117 U/L   Total Bilirubin 0.9 0.3 - 1.2 mg/dL   GFR calc non Af Amer 83 (L) >90 mL/min   GFR calc Af Amer >90 >90 mL/min    Comment: (NOTE) The eGFR has been calculated using the CKD EPI equation. This calculation has not been validated in all clinical situations. eGFR's persistently <90 mL/min signify possible Chronic Kidney Disease.    Anion gap 9 5 - 15      RADIOGRAPHY:  CLINICAL DATA: Right-sided chest pain since this morning.  EXAM: CT ANGIOGRAPHY CHEST WITH CONTRAST  TECHNIQUE: Multidetector CT imaging of the chest was performed using the standard protocol during bolus administration of intravenous contrast. Multiplanar CT image reconstructions and MIPs were obtained to evaluate the vascular anatomy.  CONTRAST: 152m OMNIPAQUE IOHEXOL 350 MG/ML SOLN  COMPARISON: CT scan of the chest dated 09/09/2014 and chest x-ray dated 09/30/2014  FINDINGS: There has been marked progression of the articular mass in the right lung apex, now all less well-defined and now measuring 5.9 x 2.8 cm, increased from 4.2 by 1.6 cm.  There is increased right hilar adenopathy now measuring 3.9 x 3.4 cm, increased from 3.3 x 2.9 cm. Precarinal lymph node is increased from 12 mm to 14 mm. Right hilar adenopathy has increased inferiorly.  There is marked enlargement of the right lobe of the thyroid gland, measuring 4.8 x 3.4 cm, increased from 4.4 x 3.2 cm.  New tiny right effusion. New cardiomegaly.  2.1 cm nodule in the left adrenal gland is relatively dense  and could represent metastatic disease.  Multiple stones in the gallbladder.  Review of the MIP images confirms the above findings.  IMPRESSION: 1. Marked increase in the right apical lung mass with new hazy density in the surrounding lung. Increased hilar and mediastinal adenopathy. 2. New cardiomegaly. 3. Interval enlargement  of the right lobe of the thyroid gland. 4. New tiny right effusion.   Electronically Signed  By: Lorriane Shire M.D.  On: 09/30/2014 19:46      PATHOLOGY:  None   ASSESSMENT:  1. New right apical mass with hilar adenopathy, mediastinal adenopathy, enlarging thyroid mass (right), and left adrenal nodule (?metastasis) needing biopsy with speedy interval growth requiring immediate biopsy due to concerns for small cell lung cancer. 2. Tobacco abuse, at least 40 pack years.  Quit on 09/29/2014. 3. Schizophrenia, controlled 4. PVCs 5. Regularly irregular with a 3:1 pattern   Patient Active Problem List   Diagnosis Date Noted  . Lung mass 09/30/2014  . Chest pain 09/30/2014  . Diabetes mellitus type 2, controlled 09/30/2014  . Constipation 10/20/2013  . History of syncope 02/16/2013  . PVC's (premature ventricular contractions) 02/16/2013  . Colon cancer screening 09/16/2012  . Schizophrenia 06/01/2012  . Diabetes mellitus 06/01/2012  . Hyperlipidemia 06/01/2012  . Diverticulosis 06/01/2012  . Hepatic steatosis 06/01/2012  . Dysphagia 04/01/2012  . HYPERTENSION, BENIGN ESSENTIAL 11/07/2009     PLAN:  1. I personally reviewed and went over laboratory results with the patient.  The results are noted within this dictation. 2. I personally reviewed and went over radiographic studies with the patient.  The results are noted within this dictation.   3. Chart reviewed 4. PET scan as scheduled on 10/10/2014. 5. Referral to CTS for biopsy and port placement 6. Return in 2-3 weeks to review data and discuss treatment options.  All questions were  answered. The patient knows to call the clinic with any problems, questions or concerns. We can certainly see the patient much sooner if necessary.  Patient and plan discussed with Dr. Ancil Linsey and she is in agreement with the aforementioned.   This note is electronically signed by: Robynn Pane 10/06/2014 3:51 PM    Patient is seen and examined, I agree with the plan as detailed above. 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 I am not sure that Alejandro [name] has full capacity to make medical decisions but certainly has an involved and supportive family. At this point they will have calmed to the conclusion may wish to proceed with additional workup and treatment. I feel that we will take this step by step and see how the patient does. Make the appropriate referrals as detailed above and arrange for PET imaging. We will see him back after formal biopsy, port placement and PET scan for additional recommendations and discussion of treatment options.  Molli Hazard, MD

## 2014-10-03 NOTE — Care Management Utilization Note (Signed)
UR completed 

## 2014-10-03 NOTE — Telephone Encounter (Signed)
You only have a 15 min opening on 5/2. Pt currently has an appt with TP on 5/5 at 4pm.   Please advise if you would like this changed to your schedule or keep appt with TP.

## 2014-10-04 NOTE — Telephone Encounter (Signed)
Pet on 10/10/14 and appt to see TP on 10/13/14 Alejandro [name]

## 2014-10-06 ENCOUNTER — Encounter (HOSPITAL_COMMUNITY): Payer: Self-pay | Admitting: Oncology

## 2014-10-06 ENCOUNTER — Encounter (HOSPITAL_COMMUNITY): Payer: Medicare Other | Attending: Oncology | Admitting: Oncology

## 2014-10-06 VITALS — BP 111/71 | HR 43 | Temp 98.0°F | Resp 15 | Ht 67.5 in | Wt 194.5 lb

## 2014-10-06 DIAGNOSIS — R918 Other nonspecific abnormal finding of lung field: Secondary | ICD-10-CM | POA: Diagnosis present

## 2014-10-06 LAB — CBC WITH DIFFERENTIAL/PLATELET
BASOS ABS: 0 10*3/uL (ref 0.0–0.1)
Basophils Relative: 0 % (ref 0–1)
Eosinophils Absolute: 0.2 10*3/uL (ref 0.0–0.7)
Eosinophils Relative: 2 % (ref 0–5)
HCT: 30.9 % — ABNORMAL LOW (ref 39.0–52.0)
Hemoglobin: 9.9 g/dL — ABNORMAL LOW (ref 13.0–17.0)
Lymphocytes Relative: 14 % (ref 12–46)
Lymphs Abs: 1.8 10*3/uL (ref 0.7–4.0)
MCH: 27.2 pg (ref 26.0–34.0)
MCHC: 32 g/dL (ref 30.0–36.0)
MCV: 84.9 fL (ref 78.0–100.0)
Monocytes Absolute: 1.7 10*3/uL — ABNORMAL HIGH (ref 0.1–1.0)
Monocytes Relative: 13 % — ABNORMAL HIGH (ref 3–12)
NEUTROS ABS: 9.4 10*3/uL — AB (ref 1.7–7.7)
NEUTROS PCT: 72 % (ref 43–77)
Platelets: 336 10*3/uL (ref 150–400)
RBC: 3.64 MIL/uL — ABNORMAL LOW (ref 4.22–5.81)
RDW: 13.5 % (ref 11.5–15.5)
WBC: 13.1 10*3/uL — AB (ref 4.0–10.5)

## 2014-10-06 LAB — COMPREHENSIVE METABOLIC PANEL
ALK PHOS: 55 U/L (ref 39–117)
ALT: 19 U/L (ref 0–53)
AST: 18 U/L (ref 0–37)
Albumin: 3.2 g/dL — ABNORMAL LOW (ref 3.5–5.2)
Anion gap: 9 (ref 5–15)
BILIRUBIN TOTAL: 0.9 mg/dL (ref 0.3–1.2)
BUN: 14 mg/dL (ref 6–23)
CO2: 29 mmol/L (ref 19–32)
CREATININE: 0.88 mg/dL (ref 0.50–1.35)
Calcium: 9.4 mg/dL (ref 8.4–10.5)
Chloride: 100 mmol/L (ref 96–112)
GFR calc Af Amer: >90 mL/min (ref >90)
GFR, EST NON AFRICAN AMERICAN: 83 mL/min — AB (ref >90)
Glucose, Bld: 83 mg/dL (ref 70–99)
POTASSIUM: 3.3 mmol/L — AB (ref 3.5–5.1)
Sodium: 138 mmol/L (ref 135–145)
Total Protein: 7.8 g/dL (ref 6.0–8.3)

## 2014-10-06 NOTE — Patient Instructions (Signed)
[place] at [facility] Discharge Instructions  RECOMMENDATIONS MADE BY THE CONSULTANT AND ANY TEST RESULTS WILL BE SENT TO YOUR REFERRING PHYSICIAN.  Exam and discussion by Robynn Pane, PA - C Get your PET scan as scheduled.  Nothing to eat or drink 6 hours prior to the scan.  Don't eat or drink anything with sugar in it as it will interfere with your test. Will refer you to a chest surgeon - they will call you with the date or time of your appointment.  Follow-up with Dr. Whitney Muse in 2 - 3 weeks after the biopsy to discuss treatment options.  Thank you for choosing [place] at [facility] to provide your oncology and hematology care.  To afford each patient quality time with our provider, please arrive at least 15 minutes before your scheduled appointment time.    You need to re-schedule your appointment should you arrive 10 or more minutes late.  We strive to give you quality time with our providers, and arriving late affects you and other patients whose appointments are after yours.  Also, if you no show three or more times for appointments you may be dismissed from the clinic at the providers discretion.     Again, thank you for choosing [facility].  Our hope is that these requests will decrease the amount of time that you wait before being seen by our physicians.       _____________________________________________________________  Should you have questions after your visit to [facility], please contact our office at [phone] between the hours of 8:30 a.m. and 4:30 p.m.  Voicemails left after 4:30 p.m. will not be returned until the following business day.  For prescription refill requests, have your pharmacy contact our office.

## 2014-10-10 ENCOUNTER — Ambulatory Visit (HOSPITAL_COMMUNITY)
Admission: RE | Admit: 2014-10-10 | Discharge: 2014-10-10 | Disposition: A | Discharge status: Home / Self Care | Payer: Medicare Other | Source: Ambulatory Visit | Attending: Internal Medicine | Admitting: Internal Medicine

## 2014-10-10 DIAGNOSIS — E119 Type 2 diabetes mellitus without complications: Secondary | ICD-10-CM | POA: Insufficient documentation

## 2014-10-10 DIAGNOSIS — C771 Secondary and unspecified malignant neoplasm of intrathoracic lymph nodes: Secondary | ICD-10-CM | POA: Diagnosis not present

## 2014-10-10 DIAGNOSIS — R590 Localized enlarged lymph nodes: Secondary | ICD-10-CM | POA: Diagnosis not present

## 2014-10-10 DIAGNOSIS — R918 Other nonspecific abnormal finding of lung field: Secondary | ICD-10-CM | POA: Insufficient documentation

## 2014-10-10 LAB — GLUCOSE, CAPILLARY: Glucose-Capillary: 91 mg/dL (ref 70–99)

## 2014-10-10 MED ORDER — FLUDEOXYGLUCOSE F - 18 (FDG) INJECTION
9.7600 | Freq: Once | INTRAVENOUS | Status: AC | PRN
  Administered 2014-10-10: 9.76 via INTRAVENOUS

## 2014-10-12 ENCOUNTER — Encounter: Payer: Self-pay | Admitting: Gastroenterology

## 2014-10-12 ENCOUNTER — Ambulatory Visit (INDEPENDENT_AMBULATORY_CARE_PROVIDER_SITE_OTHER): Payer: Medicare Other | Admitting: Gastroenterology

## 2014-10-12 ENCOUNTER — Telehealth: Payer: Self-pay | Admitting: Internal Medicine

## 2014-10-12 VITALS — BP 113/75 | HR 80 | Temp 98.3°F | Ht 66.0 in | Wt 195.0 lb

## 2014-10-12 DIAGNOSIS — K5901 Slow transit constipation: Secondary | ICD-10-CM

## 2014-10-12 DIAGNOSIS — R1310 Dysphagia, unspecified: Secondary | ICD-10-CM | POA: Diagnosis not present

## 2014-10-12 DIAGNOSIS — K760 Fatty (change of) liver, not elsewhere classified: Secondary | ICD-10-CM

## 2014-10-12 NOTE — Progress Notes (Signed)
Subjective:    Patient ID: Alejandro [name], male    DOB: 1940/10/17, 74 y.o.   MRN: [id number]  ROBERTSON, ANTHONY T, PA-C  HPI BM: EVERY DAY. SWALLOWING GOOD. UNDERGOING WORK UP FOR ?LIVER MASS AND LUNG CANCER. LIVER Bx TOMORROW. HAS A COUGH.  PT DENIES FEVER, CHILLS, HEMATOCHEZIA, nausea, vomiting, melena, diarrhea, CHEST PAIN, SHORTNESS OF BREATH, CHANGE IN BOWEL IN HABITS, constipation, abdominal pain, problems swallowing, OR heartburn or indigestion.   Past Medical History  Diagnosis Date  . Mixed hyperlipidemia   . Essential hypertension, benign   . Schizophrenia   . Positive PPD     Treated  . DM2 (diabetes mellitus, type 2)   . Diverticulosis     Pancolonic   . Hemorrhoids   . PVC's (premature ventricular contractions)   . Syncope   . Mentally challenged   . History of GI diverticular bleed   . Lung mass    Past Surgical History  Procedure Laterality Date  . Colonoscopy   07/11/2004     Rehman-Pancolonic diverticulosis/Small external hemorrhoids/ The colon was full of coffee-ground material, but no active bleeding  . Esophagogastroduodenoscopy  07/11/2004    Rehman-incomplete ring GEJ, Small sliding hiatal hernia/Bulbar duodenitis without stigmata of bleeding  . Givens capsule study  01/10/2005    Rehman-Few petechiae noted at involving gastric mucosa as well as duodenum and  jejunum/ the  surface of the small bowel mucosa could not be seen because of food   debris limiting the quality of this study.  . Colonoscopy  03/29/2008    Large mouth ascending colon and sigmoid colon diverticula which were frequent.  Otherwise, no polyps, masses, inflammatory changes or AVM seen. /Small internal hemorrhoids, otherwise normal retroflexed view of the rectum  . Arm surgery      left  . Esophagogastroduodenoscopy (egd) with esophageal dilation  04/17/2012    QQV:[id number] ZENKER'S DIVERTICULUM OR PRIMARY ESOPHGEAL MOILITY DISORDER/MODERATE Erosive gastritis/MILD Duodenal  inflammation    No Known Allergies  Current Outpatient Prescriptions  Medication Sig Dispense Refill  . albuterol (PROAIR HFA) 108 (90 BASE) MCG/ACT inhaler Inhale 2 puffs into the lungs every 4 (four) hours as needed. FOR SHORTNESS OF BREATH    . amLODipine (NORVASC) 5 MG tablet Take 5 mg by mouth daily.      Marland Kitchen aspirin (ASPIR-LOW) 81 MG EC tablet Take 81 mg by mouth daily.      . Cholecalciferol (VITAMIN D3) 2000 UNITS TABS Take 2 tablets by mouth daily.    Marland Kitchen dutasteride (AVODART) 0.5 MG capsule Take 0.5 mg by mouth daily.      Marland Kitchen econazole nitrate 1 % cream Apply 1 application topically 2 (two) times daily.     . fish oil-omega-3 fatty acids 1000 MG capsule Take 1 g by mouth 2 (two) times daily.    . fluticasone (FLONASE) 50 MCG/ACT nasal spray Place 1 spray into the nose daily.     Marland Kitchen lisinopril-hydrochlorothiazide (PRINZIDE,ZESTORETIC) 20-12.5 MG per tablet Take 1 tablet by mouth daily.     Marland Kitchen lovastatin (MEVACOR) 40 MG tablet Take 40 mg by mouth at bedtime.     . meloxicam (MOBIC) 15 MG tablet Take 15 mg by mouth daily as needed for pain.     . metFORMIN (GLUCOPHAGE) 500 MG tablet Take 500 mg by mouth 2 (two) times daily with a meal.     . metoprolol succinate (TOPROL XL) 25 MG 24 hr tablet Take 25 mg by mouth daily.      Marland Kitchen  Multiple Vitamins-Minerals (MULTIVITAMIN WITH MINERALS) tablet Take 1 tablet by mouth daily.    Marland Kitchen omeprazole (PRILOSEC) 20 MG capsule  1 po every morning    . polyethylene glycol (MIRALAX / GLYCOLAX) packet Take 17 g by mouth. PRN RARE   . risperiDONE (RISPERDAL) 3 MG tablet Take 3 mg by mouth 2 (two) times daily.     . Tamsulosin HCl (FLOMAX) 0.4 MG CAPS Take 0.4 mg by mouth daily.     . traZODone (DESYREL) 150 MG tablet Take 150 mg by mouth at bedtime.     Marland Kitchen acetaminophen (TYLENOL) 325 MG tablet Take 650 mg by mouth every 6 (six) hours as needed. Pain.     Review of Systems     Objective:   Physical Exam  Constitutional: He is oriented to person, place, and  time. He appears well-developed and well-nourished. No distress.  HENT:  Head: Normocephalic and atraumatic.  Mouth/Throat: Oropharynx is clear and moist. No oropharyngeal exudate.  Eyes: Pupils are equal, round, and reactive to light. No scleral icterus.  Neck: Normal range of motion. Neck supple.  Cardiovascular: Normal rate and normal heart sounds.   Pulmonary/Chest: Effort normal and breath sounds normal. No respiratory distress. He has no wheezes.  Abdominal: Soft. Bowel sounds are normal. He exhibits no distension. There is no tenderness.  Musculoskeletal: He exhibits no edema.  Lymphadenopathy:    He has no cervical adenopathy.  Neurological: He is alert and oriented to person, place, and time.  NO  NEW FOCAL DEFICITS   Psychiatric: He has a normal mood and affect.  Vitals reviewed.         Assessment & Plan:

## 2014-10-12 NOTE — Telephone Encounter (Signed)
Triage/[name] / Alejandro [name]   I noticed you put this patient on Alejandro P schedule. No one in ofice has seen this patient. I just took the call from [facility]. Anwyays, ok to see her if she isk ok with it. Is new consult. Patient had PET Scan  And shows bukly mediastinal mass  I can do [facility] - Monday 10/24/14 [phone]  If [facility] at [facility]: call [phone] and I can do 10/20/14 or 10/21/14 late pm after 3pm

## 2014-10-12 NOTE — Assessment & Plan Note (Addendum)
SYMPTOMS CONTROLLED/RESOLVED. PT AND CARE GIVER DENY PT HAVING TROUBLE SWALLOWING. GERD CONTROLLED.   CONTINUE OMEPRAZOLE.  TAKE 30 MINUTES PRIOR TO YOUR FIRST MEAL DAILY. CONTINUE TO MONITOR SYMPTOMS. FOLLOW UP IN NOV 2016.

## 2014-10-12 NOTE — Assessment & Plan Note (Signed)
WEIGHT DOWN 3 LBS-STILL BMI > 30. PENDING XRT AND CHEMO FOR LUNG MASS.  CONTINUE TO MONITOR SYMPTOMS. FOLLOW UP IN 6 MOS.

## 2014-10-12 NOTE — Assessment & Plan Note (Signed)
SYMPTOMS CONTROLLED/RESOLVED WITH DIET.  CONTINUE TO MONITOR SYMPTOMS. FOLLOW UP IN 6 MOS.  

## 2014-10-12 NOTE — Progress Notes (Signed)
CC'ED TO PCP 

## 2014-10-12 NOTE — Progress Notes (Signed)
On recall for ov 04/2015

## 2014-10-12 NOTE — Telephone Encounter (Signed)
Called and spoke to facility coordinator as pt is a resident. Appt changed from 5/5 to 5/9 with MR at 1:30. The facility administrator is to call on 5/5 to verify the appt can stay on 5/9 and will not need to be changed. Will await call.

## 2014-10-12 NOTE — Patient Instructions (Signed)
CONTINUE OMEPRAZOLE.  TAKE 30 MINUTES PRIOR TO YOUR FIRST MEAL.  FOLLOW UP IN NOV 2016.

## 2014-10-13 ENCOUNTER — Inpatient Hospital Stay: Payer: Medicare Other | Admitting: Adult Health

## 2014-10-13 NOTE — Telephone Encounter (Signed)
Called and spoke to the facility coordinator and the updated appt is cleared by the administrator. Pt's appt is on 5/9 at 1:30p. MR aware. Nothing further needed.

## 2014-10-14 ENCOUNTER — Encounter (HOSPITAL_COMMUNITY): Payer: Self-pay | Admitting: *Deleted

## 2014-10-17 ENCOUNTER — Encounter: Payer: Self-pay | Admitting: Internal Medicine

## 2014-10-17 ENCOUNTER — Ambulatory Visit (INDEPENDENT_AMBULATORY_CARE_PROVIDER_SITE_OTHER): Payer: Medicare Other | Admitting: Internal Medicine

## 2014-10-17 VITALS — BP 102/64 | HR 77 | Ht 66.0 in | Wt 197.0 lb

## 2014-10-17 DIAGNOSIS — R599 Enlarged lymph nodes, unspecified: Secondary | ICD-10-CM

## 2014-10-17 DIAGNOSIS — R0600 Dyspnea, unspecified: Secondary | ICD-10-CM | POA: Insufficient documentation

## 2014-10-17 DIAGNOSIS — R0689 Other abnormalities of breathing: Secondary | ICD-10-CM | POA: Diagnosis not present

## 2014-10-17 DIAGNOSIS — R590 Localized enlarged lymph nodes: Secondary | ICD-10-CM

## 2014-10-17 DIAGNOSIS — R918 Other nonspecific abnormal finding of lung field: Secondary | ICD-10-CM

## 2014-10-17 NOTE — Patient Instructions (Addendum)
ICD-9-CM ICD-10-CM   1. Lung mass 786.6 R91.8   2. Mediastinal adenopathy 785.6 R59.9   3. Dyspnea and respiratory abnormality 786.09 R06.00     R06.89    #Lung mass and mediastinal adenopathy Likely lung cancer Keep up appt with Dr  Roxan Hockey 10/18/14 tomorrow  - I will talk to Dr Roxan Hockey and likely have him to biopsy  - If he does biopsy then will cancel schedule with me which is currently for 10/24/14  #Dyspna  - do spirometry pre- and post- bronchodilatory next few weeks  #Followup  - next few week after spirometry

## 2014-10-17 NOTE — Progress Notes (Signed)
Subjective:    Patient ID: Alejandro [name], male    DOB: November 14, 1940, 74 y.o.   MRN: [id number] PCP Karna Dupes T, PA-C  HPI  IOV 10/17/2014  Chief Complaint  Patient presents with  . Pulmonary Consult    Pt seen at [facility] and here after PET scan.     46 year old African-American male. Accompanied by his caretaker [name] who gives most of the history. History also pain from review of the chart. He's been referred for lung mass. His healthcare power of attorney is Mr. Siefert and his sister Dock Baccam. The cell phone numbers respectively [phone] and [phone] respectively. Patient lives at [facility] because of his schizophrenia in [place] x 12 years.   According to the caretaker whoherself is a poor historian. Patient quit smoking recently and since then and is been no dyspnea or cough. They claim that he is completely asymptomatic but in fact when we walked him here after walking 185 feet 2 laps out of 3 laps he got dyspneic and could not walk anymore. However he did not desaturate. UCurently denies  dyspnea or associated symptoms or severity or cough or chest pain  A recent admission in end of April 2016 details unknown shows lung mass right apical with mediastinal nodes. Therefore I recommended a PET scan which he had 10/10/2014 presents for follow-up. 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 Patient presents here for biopsy evaluation. However, in the interim he did see his local oncologist was made a parallel referral to Dr. Roxan Hockey in thoracic oncology. Patient and caretaker believe that the visit tomorrow is mainly to schedule biopsy.   CT chest 09/30/14 - personally reviewed images IMPRESSION: 1. Marked increase in the right apical lung mass 6cm with new hazy density in the surrounding lung. Increased hilar and mediastinal 4cm adenopathy. 2. New cardiomegaly. 3. Interval  enlargement of the right lobe of the thyroid gland. 4. New tiny right effusion.   Electronically Signed  By: Lorriane Shire M.D.  On: 09/30/2014 19:46   PET SCAN 10/10/14 IMPRESSION: 1. Hypermetabolic right apical lung mass consistent with primary lung neoplasm. Surrounding inflammatory changes is likely obstructive pneumonitis. 2. Bulky metastatic right hilar and mediastinal lymphadenopathy. 3. 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   Electronically Signed  By: Marijo Sanes M.D.  On: 10/10/2014 14:24   Walking desaturation test: 185 feet 3 laps on room air in the office: He only walk 2 laps and then stop due to dyspnea. He could not go any further. He did not desaturate   has a past medical history of Mixed hyperlipidemia; Essential hypertension, benign; Schizophrenia; Positive PPD; DM2 (diabetes mellitus, type 2); Diverticulosis; Hemorrhoids; PVC's (premature ventricular contractions); Syncope; Mentally challenged; History of GI diverticular bleed; and Lung mass.   reports that he quit smoking about 2 weeks ago. His smoking use included Cigarettes. He has a 60 pack-year smoking history. He quit smokeless tobacco use 10 days ago.  Past Surgical History  Procedure Laterality Date  . Colonoscopy   07/11/2004     Rehman-Pancolonic diverticulosis/Small external hemorrhoids/ The colon was full of coffee-ground material, but no active bleeding  . Esophagogastroduodenoscopy  07/11/2004    Rehman-incomplete ring GEJ, Small sliding hiatal hernia/Bulbar duodenitis without stigmata of bleeding  . Givens capsule study  01/10/2005    Rehman-Few petechiae noted at involving gastric mucosa as well as duodenum and  jejunum/ the  surface of the small bowel  mucosa could not be seen because of food   debris limiting the quality of this study.  . Colonoscopy  03/29/2008    Large mouth ascending colon and sigmoid colon diverticula which were frequent.  Otherwise, no polyps, masses,  inflammatory changes or AVM seen. /Small internal hemorrhoids, otherwise normal retroflexed view of the rectum  . Arm surgery      left  . Esophagogastroduodenoscopy (egd) with esophageal dilation  04/17/2012    PJA:[id number] ZENKER'S DIVERTICULUM OR PRIMARY ESOPHGEAL MOILITY DISORDER/MODERATE Erosive gastritis/MILD Duodenal inflammation     No Known Allergies  Immunization History  Administered Date(s) Administered  . Influenza Split 03/31/2014  . Pneumococcal-Unspecified 03/31/2014    Family History  Problem Relation Age of Onset  . Stroke Father   . Stroke Mother   . CAD Mother      Current outpatient prescriptions:  .  amLODipine (NORVASC) 5 MG tablet, Take 5 mg by mouth daily.  , Disp: , Rfl:  .  aspirin (ASPIR-LOW) 81 MG EC tablet, Take 81 mg by mouth daily.  , Disp: , Rfl:  .  Cholecalciferol (VITAMIN D3) 2000 UNITS TABS, Take 2 tablets by mouth daily., Disp: , Rfl:  .  dutasteride (AVODART) 0.5 MG capsule, Take 0.5 mg by mouth daily.  , Disp: , Rfl:  .  econazole nitrate 1 % cream, Apply 1 application topically 2 (two) times daily. , Disp: , Rfl:  .  fish oil-omega-3 fatty acids 1000 MG capsule, Take 1 g by mouth 2 (two) times daily., Disp: , Rfl:  .  fluticasone (FLONASE) 50 MCG/ACT nasal spray, Place 1 spray into the nose daily. , Disp: , Rfl:  .  lisinopril-hydrochlorothiazide (PRINZIDE,ZESTORETIC) 20-12.5 MG per tablet, Take 1 tablet by mouth daily. , Disp: , Rfl:  .  lovastatin (MEVACOR) 40 MG tablet, Take 40 mg by mouth at bedtime. , Disp: , Rfl:  .  meloxicam (MOBIC) 15 MG tablet, Take 15 mg by mouth daily as needed for pain. , Disp: , Rfl:  .  metFORMIN (GLUCOPHAGE) 500 MG tablet, Take 500 mg by mouth 2 (two) times daily with a meal. , Disp: , Rfl:  .  metoprolol succinate (TOPROL XL) 25 MG 24 hr tablet, Take 25 mg by mouth every morning. , Disp: , Rfl:  .  Multiple Vitamins-Minerals (MULTIVITAMIN WITH MINERALS) tablet, Take 1 tablet by mouth daily., Disp: , Rfl:  .   omeprazole (PRILOSEC) 20 MG capsule, Take 20 mg by mouth every morning. 1 po every morning, Disp: , Rfl:  .  polyethylene glycol (MIRALAX / GLYCOLAX) packet, Take 17 g by mouth See admin instructions. Mix 1 capful (17 gm) in 8 oz of water twice daily until regular bowel movements occur, then start once daily as needed for constipation., Disp: , Rfl:  .  risperiDONE (RISPERDAL) 3 MG tablet, Take 3 mg by mouth 2 (two) times daily. , Disp: , Rfl:  .  Tamsulosin HCl (FLOMAX) 0.4 MG CAPS, Take 0.4 mg by mouth daily. , Disp: , Rfl:  .  traZODone (DESYREL) 150 MG tablet, Take 150 mg by mouth at bedtime. , Disp: , Rfl:     Review of Systems  Constitutional: Negative for fever and unexpected weight change.  HENT: Negative for congestion, dental problem, ear pain, nosebleeds, postnasal drip, rhinorrhea, sinus pressure, sneezing, sore throat and trouble swallowing.   Eyes: Negative for redness and itching.  Respiratory: Positive for cough and shortness of breath. Negative for chest tightness and wheezing.   Cardiovascular:  Negative for palpitations and leg swelling.  Gastrointestinal: Negative for nausea and vomiting.  Genitourinary: Negative for dysuria.  Musculoskeletal: Negative for joint swelling.  Skin: Negative for rash.  Neurological: Negative for headaches.  Hematological: Does not bruise/bleed easily.  Psychiatric/Behavioral: Negative for dysphoric mood. The patient is not nervous/anxious.        Objective:   Physical Exam  Constitutional: He is oriented to person, place, and time. He appears well-developed and well-nourished. No distress.  Deconditioned looking male  HENT:  Head: Normocephalic and atraumatic.  Right Ear: External ear normal.  Left Ear: External ear normal.  Mouth/Throat: Oropharynx is clear and moist. No oropharyngeal exudate.  Eyes: Conjunctivae and EOM are normal. Pupils are equal, round, and reactive to light. Right eye exhibits no discharge. Left eye exhibits no  discharge. No scleral icterus.  Neck: Normal range of motion. Neck supple. No JVD present. No tracheal deviation present. No thyromegaly present.  Cardiovascular: Normal rate, regular rhythm and intact distal pulses.  Exam reveals no gallop and no friction rub.   No murmur heard. Pulmonary/Chest: Effort normal and breath sounds normal. No respiratory distress. He has no wheezes. He has no rales. He exhibits no tenderness.  Abdominal: Soft. Bowel sounds are normal. He exhibits no distension and no mass. There is no tenderness. There is no rebound and no guarding.  Musculoskeletal: Normal range of motion. He exhibits no edema or tenderness.  Lymphadenopathy:    He has no cervical adenopathy.  Neurological: He is alert and oriented to person, place, and time. He has normal reflexes. No cranial nerve deficit. Coordination normal.  Skin: Skin is warm and dry. No rash noted. He is not diaphoretic. No erythema. No pallor.  Psychiatric:  Significant cognitive impairment due to schizophrenia  Nursing note and vitals reviewed.   Filed Vitals:   10/17/14 1407  BP: 102/64  Pulse: 77  Height: '5\' 6"'$  (1.676 m)  Weight: 197 lb (89.359 kg)  SpO2: 97%         Assessment & Plan:      ICD-9-CM ICD-10-CM   1. Lung mass 786.6 R91.8 Pulmonary function test  2. Mediastinal adenopathy 785.6 R59.9 Pulmonary function test  3. Dyspnea and respiratory abnormality 786.09 R06.00 Pulmonary function test    R06.89    #Lung mass and mediastinal adenopathy - My plan was to perform endobronchial guided ultrasound bronchoscopy biopsy. However patient is due to see thoracic surgeon Dr. Modesto Charon tomorrow. I think it is best all the biopsies are done by the surgeon. In this situation and the rare event the endobronchial ultrasound is nondiagnostic mediastinoscopy can be performed immediately on the spot. I will send my note to Dr. Roxan Hockey. If Dr. Roxan Hockey feels otherwise I'm happy to do the biopsy on  10/24/2014  #Dyspnea - I think this might be due to COPD and deconditioning. I do not think patient can cooperate with a full pulmonary function test. Therefore will do spirometry and reassess     Dr. Brand Males, M.D., [facility].C.P Pulmonary and Critical Care Medicine Staff Physician [place] Pulmonary and Critical Care Pager: [phone], If no answer or between  15:00h - 7:00h: call [phone]  10/17/2014 3:05 PM     Future Appointments Date Time Provider [place]  10/18/2014 2:30 PM Melrose Nakayama, MD [facility]  10/24/2014 7:30 AM [facility] TECH [facility] None  10/28/2014 9:10 AM Patrici Ranks, MD [facility] None

## 2014-10-18 ENCOUNTER — Encounter: Payer: Self-pay | Admitting: Thoracic Surgery (Cardiothoracic Vascular Surgery)

## 2014-10-18 ENCOUNTER — Institutional Professional Consult (permissible substitution) (INDEPENDENT_AMBULATORY_CARE_PROVIDER_SITE_OTHER): Payer: Medicare Other | Admitting: Thoracic Surgery (Cardiothoracic Vascular Surgery)

## 2014-10-18 ENCOUNTER — Telehealth: Payer: Self-pay | Admitting: Internal Medicine

## 2014-10-18 VITALS — BP 115/77 | HR 77 | Resp 20 | Ht 66.0 in | Wt 197.0 lb

## 2014-10-18 DIAGNOSIS — R599 Enlarged lymph nodes, unspecified: Secondary | ICD-10-CM | POA: Diagnosis not present

## 2014-10-18 DIAGNOSIS — R590 Localized enlarged lymph nodes: Secondary | ICD-10-CM

## 2014-10-18 DIAGNOSIS — R918 Other nonspecific abnormal finding of lung field: Secondary | ICD-10-CM

## 2014-10-18 DIAGNOSIS — E278 Other specified disorders of adrenal gland: Secondary | ICD-10-CM

## 2014-10-18 DIAGNOSIS — E279 Disorder of adrenal gland, unspecified: Secondary | ICD-10-CM

## 2014-10-18 NOTE — Progress Notes (Signed)
PCP is ROBERTSON, Thomasena Edis, PA-C Referring Provider is Baird Cancer, PA-C  Chief Complaint  Patient presents with  . Lung Mass    Surgical eval, CTA Chest 09/30/14, PET Scan 10/10/14    HPI: 74 year old gentleman sent for consultation regarding a right lung mass with mediastinal adenopathy.  Mr. Rockett is a poor historian. He is accompanied by his caretaker from his home.  Mr. Kroh is a 74 year old man with a history of schizophrenia, hypertension, hyperlipidemia, type 2 diabetes, frequent PVCs, positive PPD, and gastrointestinal bleeding. His primary complaint recently has been chest pain. This is a right sided pain that is worsened by coughing. He was admitted to [facility] and during that evaluation had a CT scan which showed a right upper lobe mass and mediastinal adenopathy. He was referred to Dr. Chase Caller also saw Robynn Pane at [facility].  He denies shortness of breath, but has very limited exercise tolerance on testing at the pulmonology office. He does complain of chest pain. He has a nonproductive cough. He quit smoking 2 weeks ago. He denies any weight loss and says he has gained 10 pounds in the last 3 months.   Past Medical History  Diagnosis Date  . Mixed hyperlipidemia   . Essential hypertension, benign   . Schizophrenia   . Positive PPD     Treated  . DM2 (diabetes mellitus, type 2)   . Diverticulosis     Pancolonic   . Hemorrhoids   . PVC's (premature ventricular contractions)   . Syncope   . Mentally challenged   . History of GI diverticular bleed   . Lung mass     Past Surgical History  Procedure Laterality Date  . Colonoscopy   07/11/2004     Rehman-Pancolonic diverticulosis/Small external hemorrhoids/ The colon was full of coffee-ground material, but no active bleeding  . Esophagogastroduodenoscopy  07/11/2004    Rehman-incomplete ring GEJ, Small sliding hiatal hernia/Bulbar duodenitis without stigmata of bleeding  . Givens capsule study   01/10/2005    Rehman-Few petechiae noted at involving gastric mucosa as well as duodenum and  jejunum/ the  surface of the small bowel mucosa could not be seen because of food   debris limiting the quality of this study.  . Colonoscopy  03/29/2008    Large mouth ascending colon and sigmoid colon diverticula which were frequent.  Otherwise, no polyps, masses, inflammatory changes or AVM seen. /Small internal hemorrhoids, otherwise normal retroflexed view of the rectum  . Arm surgery      left  . Esophagogastroduodenoscopy (egd) with esophageal dilation  04/17/2012    XBJ:[id number] ZENKER'S DIVERTICULUM OR PRIMARY ESOPHGEAL MOILITY DISORDER/MODERATE Erosive gastritis/MILD Duodenal inflammation     Family History  Problem Relation Age of Onset  . Stroke Father   . Stroke Mother   . CAD Mother     Social History History  Substance Use Topics  . Smoking status: Former Smoker -- 1.50 packs/day for 40 years    Types: Cigarettes    Quit date: 10/03/2014  . Smokeless tobacco: Former Systems developer    Quit date: 10/07/2014     Comment: Pt smoked 1.5 ppd for 40 years then 0.5ppd for 10 more years.   . Alcohol Use: No    Current Outpatient Prescriptions  Medication Sig Dispense Refill  . amLODipine (NORVASC) 5 MG tablet Take 5 mg by mouth daily.      Marland Kitchen aspirin (ASPIR-LOW) 81 MG EC tablet Take 81 mg by mouth daily.      Marland Kitchen  Cholecalciferol (VITAMIN D3) 2000 UNITS TABS Take 2 tablets by mouth daily.    Marland Kitchen dutasteride (AVODART) 0.5 MG capsule Take 0.5 mg by mouth daily.      Marland Kitchen econazole nitrate 1 % cream Apply 1 application topically 2 (two) times daily.     . fish oil-omega-3 fatty acids 1000 MG capsule Take 1 g by mouth 2 (two) times daily.    . fluticasone (FLONASE) 50 MCG/ACT nasal spray Place 1 spray into the nose daily.     Marland Kitchen lisinopril-hydrochlorothiazide (PRINZIDE,ZESTORETIC) 20-12.5 MG per tablet Take 1 tablet by mouth daily.     Marland Kitchen lovastatin (MEVACOR) 40 MG tablet Take 40 mg by mouth at bedtime.      . meloxicam (MOBIC) 15 MG tablet Take 15 mg by mouth daily as needed for pain.     . metFORMIN (GLUCOPHAGE) 500 MG tablet Take 500 mg by mouth 2 (two) times daily with a meal.     . metoprolol succinate (TOPROL XL) 25 MG 24 hr tablet Take 25 mg by mouth every morning.     . Multiple Vitamins-Minerals (MULTIVITAMIN WITH MINERALS) tablet Take 1 tablet by mouth daily.    Marland Kitchen omeprazole (PRILOSEC) 20 MG capsule Take 20 mg by mouth every morning. 1 po every morning    . polyethylene glycol (MIRALAX / GLYCOLAX) packet Take 17 g by mouth See admin instructions. Mix 1 capful (17 gm) in 8 oz of water twice daily until regular bowel movements occur, then start once daily as needed for constipation.    . risperiDONE (RISPERDAL) 3 MG tablet Take 3 mg by mouth 2 (two) times daily.     . Tamsulosin HCl (FLOMAX) 0.4 MG CAPS Take 0.4 mg by mouth daily.     . traZODone (DESYREL) 150 MG tablet Take 150 mg by mouth at bedtime.      No current facility-administered medications for this visit.    No Known Allergies  Review of Systems  Constitutional: Positive for unexpected weight change (Weight gain 10 pounds in 3 months).  HENT:       Hoarse  Respiratory: Positive for cough (nnonproductive, denies hemoptysis) and shortness of breath (Mild with exertion).   Cardiovascular: Positive for chest pain (right-sided chest pain worsened with coughing).  Neurological: Negative.   Hematological: Does not bruise/bleed easily.  All other systems reviewed and are negative.   BP 115/77 mmHg  Pulse 77  Resp 20  Ht '5\' 6"'$  (1.676 m)  Wt 197 lb (89.359 kg)  BMI 31.81 kg/m2  SpO2 96% Physical Exam  Constitutional: He appears well-developed and well-nourished. No distress.  HENT:  Head: Normocephalic and atraumatic.  Eyes: EOM are normal.  Neck: Neck supple. No thyromegaly present.  Cardiovascular: Normal heart sounds and intact distal pulses.   No murmur heard. Irregular rhythm  Pulmonary/Chest: Effort normal. He  has no wheezes. He has no rales.  Abdominal: Soft. There is no tenderness.  Musculoskeletal: He exhibits no edema.  Lymphadenopathy:    He has no cervical adenopathy.  Neurological: He is alert. No cranial nerve deficit.  Skin: Skin is warm and dry.  Psychiatric:  Flat affect  Vitals reviewed.    Diagnostic Tests: NUCLEAR MEDICINE PET SKULL BASE TO THIGH  TECHNIQUE: 9.76 mCi F-18 FDG was injected intravenously. Full-ring PET imaging was performed from the skull base to thigh after the radiotracer. CT data was obtained and used for attenuation correction and anatomic localization.  FASTING BLOOD GLUCOSE: Value: 91 mg/dl  COMPARISON: Chest CTs 4/1  and 09/30/2014  FINDINGS: NECK  No hypermetabolic lymph nodes in the neck.  CHEST  The bilobed right apical lung mass is hypermetabolic with SUV max of 70.2. Surrounding lung disease could be obstructive pneumonitis. There is bulky hypermetabolic right hilar and mediastinal lymphadenopathy with SUV max of 23.4. There are also hypermetabolic anterior mediastinal lymph nodes with SUV max of 8.8. No contralateral adenopathy. No other worrisome pulmonary lesions. 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  ABDOMEN/PELVIS  18 mm left adrenal gland nodule is mildly hypermetabolic with SUV max 2.4. This measures 32 Hounsfield units and is most likely a metastasis.  No hepatic metastatic disease is demonstrated. There is a contracted stone filled gallbladder.  Simple appearing bilateral renal cysts. No abdominal/ pelvic lymphadenopathy.  SKELETON  No focal hypermetabolic activity to suggest skeletal metastasis.  IMPRESSION: 1. Hypermetabolic right apical lung mass consistent with primary lung neoplasm. Surrounding inflammatory changes is likely obstructive pneumonitis. 2. Bulky metastatic right hilar and mediastinal lymphadenopathy. 3. 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   Electronically Signed   By: Marijo Sanes M.D.  On: 10/10/2014 14:24   Impression: 74 year old man with a history of heavy tobacco abuse and numerous other psychiatric and medical issues. He has a right upper lobe mass and extensive hilar mediastinal adenopathy. There also is possibly a left adrenal metastasis. This is highly suspicious for small cell carcinoma. He does have a history of a positive PPD in the past, but that was treated. I do not think this is infectious in nature.  I discussed the case with Dr. Chase Caller yesterday. We felt it was imperative to get a biopsy as quickly as possible. We also are both concerned that bronchoscopic and endobronchial ultrasound biopsies could be nondiagnostic. We feel it would be best if this was done in the OR setting so that we could proceed with mediastinoscopy if necessary to make a definitive diagnosis.  I discussed the proposed procedure with Mr. Basnett and his caretaker. We would plan to do a bronchoscopy, endobronchial ultrasound, and possible mediastinal endoscopy. I reviewed the indications, risks, benefits, and alternatives. We would do this as an outpatient procedure. We would only do mediastinal endoscopy if both bronchoscopy and EBUS were nondiagnostic. He does understand that if mediastinal endoscopy is necessary it is a surgical procedure involving an incision. I reviewed the risks which include but are not limited to death, MI, DVT, PE, bleeding, possible need for transfusion, infection, pneumothorax, failure to make a diagnosis, as well as the possibility of unforeseeable complications.  Plan: Bronchoscopy, endobronchial ultrasound, possible mediastinal endoscopy on Monday, 10/24/2014  Melrose Nakayama, MD [facility] [phone]

## 2014-10-18 NOTE — Telephone Encounter (Signed)
Alejandro [name]  Dr Roxan Hockey called me 5:14 PM 10/18/2014 . He will do biopsy. So please call bronch lab [phone] and cancel with me. Just have him do PFT in a month or so and he can come and see TP for fu  THanks  Dr. Brand Males, M.D., [facility].C.P Pulmonary and Critical Care Medicine Staff Physician [place] Pulmonary and Critical Care Pager: [phone], If no answer or between  15:00h - 7:00h: call [phone]  10/18/2014 5:14 PM

## 2014-10-19 ENCOUNTER — Other Ambulatory Visit: Payer: Self-pay | Admitting: *Deleted

## 2014-10-19 DIAGNOSIS — R590 Localized enlarged lymph nodes: Secondary | ICD-10-CM

## 2014-10-19 DIAGNOSIS — R918 Other nonspecific abnormal finding of lung field: Secondary | ICD-10-CM

## 2014-10-20 NOTE — Telephone Encounter (Signed)
PFT and OV with TP scheduled for 11/17/14.  PFT at 1pm and OV at 3:15 - both at [facility] with supervisor at [place], aware of appt date/time Will call if any issues getting here for that appt.  Nothing further needed.

## 2014-10-20 NOTE — Telephone Encounter (Signed)
[phone] RT cb

## 2014-10-20 NOTE — Telephone Encounter (Signed)
lmtcb for RT.

## 2014-10-23 MED ORDER — DEXTROSE 5 % IV SOLN
1.5000 g | INTRAVENOUS | Status: DC
  Filled 2014-10-23: qty 1.5

## 2014-10-24 ENCOUNTER — Encounter (HOSPITAL_COMMUNITY)
Admission: RE | Disposition: A | Discharge status: Home / Self Care | Payer: Self-pay | Source: Ambulatory Visit | Attending: Thoracic Surgery (Cardiothoracic Vascular Surgery)

## 2014-10-24 ENCOUNTER — Ambulatory Visit (HOSPITAL_COMMUNITY): Payer: Medicare Other

## 2014-10-24 ENCOUNTER — Ambulatory Visit (HOSPITAL_COMMUNITY): Admission: RE | Admit: 2014-10-24 | Payer: Medicare Other | Source: Ambulatory Visit | Admitting: Internal Medicine

## 2014-10-24 ENCOUNTER — Ambulatory Visit (HOSPITAL_COMMUNITY)
Admission: RE | Admit: 2014-10-24 | Discharge: 2014-10-24 | Disposition: A | Discharge status: Home / Self Care | Payer: Medicare Other | Source: Ambulatory Visit | Attending: Thoracic Surgery (Cardiothoracic Vascular Surgery) | Admitting: Thoracic Surgery (Cardiothoracic Vascular Surgery)

## 2014-10-24 ENCOUNTER — Encounter (HOSPITAL_COMMUNITY): Payer: Medicare Other

## 2014-10-24 ENCOUNTER — Ambulatory Visit (HOSPITAL_COMMUNITY): Payer: Medicare Other | Admitting: Anesthesiology

## 2014-10-24 ENCOUNTER — Encounter (HOSPITAL_COMMUNITY): Admission: RE | Payer: Self-pay | Source: Ambulatory Visit

## 2014-10-24 DIAGNOSIS — F209 Schizophrenia, unspecified: Secondary | ICD-10-CM | POA: Diagnosis not present

## 2014-10-24 DIAGNOSIS — R0602 Shortness of breath: Secondary | ICD-10-CM | POA: Insufficient documentation

## 2014-10-24 DIAGNOSIS — R918 Other nonspecific abnormal finding of lung field: Secondary | ICD-10-CM

## 2014-10-24 DIAGNOSIS — C3410 Malignant neoplasm of upper lobe, unspecified bronchus or lung: Secondary | ICD-10-CM

## 2014-10-24 DIAGNOSIS — Z791 Long term (current) use of non-steroidal anti-inflammatories (NSAID): Secondary | ICD-10-CM | POA: Insufficient documentation

## 2014-10-24 DIAGNOSIS — E669 Obesity, unspecified: Secondary | ICD-10-CM | POA: Insufficient documentation

## 2014-10-24 DIAGNOSIS — C3411 Malignant neoplasm of upper lobe, right bronchus or lung: Secondary | ICD-10-CM | POA: Diagnosis not present

## 2014-10-24 DIAGNOSIS — R079 Chest pain, unspecified: Secondary | ICD-10-CM | POA: Diagnosis not present

## 2014-10-24 DIAGNOSIS — Z7982 Long term (current) use of aspirin: Secondary | ICD-10-CM | POA: Insufficient documentation

## 2014-10-24 DIAGNOSIS — Z87891 Personal history of nicotine dependence: Secondary | ICD-10-CM | POA: Diagnosis not present

## 2014-10-24 DIAGNOSIS — I10 Essential (primary) hypertension: Secondary | ICD-10-CM | POA: Diagnosis not present

## 2014-10-24 DIAGNOSIS — E782 Mixed hyperlipidemia: Secondary | ICD-10-CM | POA: Diagnosis not present

## 2014-10-24 DIAGNOSIS — C771 Secondary and unspecified malignant neoplasm of intrathoracic lymph nodes: Secondary | ICD-10-CM | POA: Diagnosis not present

## 2014-10-24 DIAGNOSIS — Z7951 Long term (current) use of inhaled steroids: Secondary | ICD-10-CM | POA: Diagnosis not present

## 2014-10-24 DIAGNOSIS — E119 Type 2 diabetes mellitus without complications: Secondary | ICD-10-CM | POA: Diagnosis not present

## 2014-10-24 DIAGNOSIS — Z79899 Other long term (current) drug therapy: Secondary | ICD-10-CM | POA: Insufficient documentation

## 2014-10-24 DIAGNOSIS — R05 Cough: Secondary | ICD-10-CM | POA: Insufficient documentation

## 2014-10-24 DIAGNOSIS — R590 Localized enlarged lymph nodes: Secondary | ICD-10-CM

## 2014-10-24 HISTORY — PX: VIDEO BRONCHOSCOPY WITH ENDOBRONCHIAL ULTRASOUND: SHX6177

## 2014-10-24 LAB — GLUCOSE, CAPILLARY
GLUCOSE-CAPILLARY: 90 mg/dL (ref 65–99)
GLUCOSE-CAPILLARY: 101 mg/dL — AB (ref 65–99)

## 2014-10-24 LAB — COMPREHENSIVE METABOLIC PANEL
ALT: 10 U/L — ABNORMAL LOW (ref 17–63)
AST: 14 U/L — ABNORMAL LOW (ref 15–41)
Albumin: 2.9 g/dL — ABNORMAL LOW (ref 3.5–5.0)
Alkaline Phosphatase: 46 U/L (ref 38–126)
Anion gap: 9 (ref 5–15)
BILIRUBIN TOTAL: 0.7 mg/dL (ref 0.3–1.2)
BUN: 9 mg/dL (ref 6–20)
CHLORIDE: 101 mmol/L (ref 101–111)
CO2: 29 mmol/L (ref 22–32)
CREATININE: 0.96 mg/dL (ref 0.61–1.24)
Calcium: 9.3 mg/dL (ref 8.9–10.3)
GFR calc Af Amer: >60 mL/min (ref >60)
GFR calc non Af Amer: >60 mL/min (ref >60)
Glucose, Bld: 102 mg/dL — ABNORMAL HIGH (ref 65–99)
POTASSIUM: 3.6 mmol/L (ref 3.5–5.1)
Sodium: 139 mmol/L (ref 135–145)
Total Protein: 6.8 g/dL (ref 6.5–8.1)

## 2014-10-24 LAB — ABO/RH: ABO/RH(D): A POS

## 2014-10-24 LAB — TYPE AND SCREEN
ABO/RH(D): A POS
Antibody Screen: NEGATIVE

## 2014-10-24 LAB — CBC
HCT: 32.6 % — ABNORMAL LOW (ref 39.0–52.0)
Hemoglobin: 10.4 g/dL — ABNORMAL LOW (ref 13.0–17.0)
MCH: 26.7 pg (ref 26.0–34.0)
MCHC: 31.9 g/dL (ref 30.0–36.0)
MCV: 83.6 fL (ref 78.0–100.0)
PLATELETS: 280 10*3/uL (ref 150–400)
RBC: 3.9 MIL/uL — ABNORMAL LOW (ref 4.22–5.81)
RDW: 13.7 % (ref 11.5–15.5)
WBC: 8.2 10*3/uL (ref 4.0–10.5)

## 2014-10-24 LAB — PROTIME-INR
INR: 1.11 (ref 0.00–1.49)
Prothrombin Time: 14.4 seconds (ref 11.6–15.2)

## 2014-10-24 LAB — APTT: APTT: 31 s (ref 24–37)

## 2014-10-24 SURGERY — ENDOBRONCHIAL ULTRASOUND (EBUS)
Anesthesia: General | Laterality: Bilateral

## 2014-10-24 SURGERY — BRONCHOSCOPY, WITH EBUS
Anesthesia: General | Site: Bronchus

## 2014-10-24 MED ORDER — LACTATED RINGERS IV SOLN
INTRAVENOUS | Status: DC
  Administered 2014-10-24 (×3): via INTRAVENOUS

## 2014-10-24 MED ORDER — ONDANSETRON HCL 4 MG/2ML IJ SOLN
INTRAMUSCULAR | Status: AC
  Filled 2014-10-24: qty 2

## 2014-10-24 MED ORDER — EPHEDRINE SULFATE 50 MG/ML IJ SOLN
INTRAMUSCULAR | Status: AC
  Filled 2014-10-24: qty 1

## 2014-10-24 MED ORDER — HYDRALAZINE HCL 20 MG/ML IJ SOLN
INTRAMUSCULAR | Status: AC
  Filled 2014-10-24: qty 1

## 2014-10-24 MED ORDER — ROCURONIUM BROMIDE 100 MG/10ML IV SOLN
INTRAVENOUS | Status: DC | PRN
  Administered 2014-10-24: 40 mg via INTRAVENOUS
  Administered 2014-10-24: 10 mg via INTRAVENOUS

## 2014-10-24 MED ORDER — SUCCINYLCHOLINE CHLORIDE 20 MG/ML IJ SOLN
INTRAMUSCULAR | Status: AC
  Filled 2014-10-24: qty 1

## 2014-10-24 MED ORDER — NEOSTIGMINE METHYLSULFATE 10 MG/10ML IV SOLN
INTRAVENOUS | Status: DC | PRN
  Administered 2014-10-24: 5 mg via INTRAVENOUS

## 2014-10-24 MED ORDER — SODIUM CHLORIDE 0.9 % IJ SOLN
INTRAMUSCULAR | Status: AC
  Filled 2014-10-24: qty 10

## 2014-10-24 MED ORDER — ONDANSETRON HCL 4 MG/2ML IJ SOLN
INTRAMUSCULAR | Status: DC | PRN
  Administered 2014-10-24: 4 mg via INTRAVENOUS

## 2014-10-24 MED ORDER — EPINEPHRINE HCL 1 MG/ML IJ SOLN
INTRAMUSCULAR | Status: AC
  Filled 2014-10-24: qty 1

## 2014-10-24 MED ORDER — PROPOFOL 10 MG/ML IV BOLUS
INTRAVENOUS | Status: AC
  Filled 2014-10-24: qty 20

## 2014-10-24 MED ORDER — EPHEDRINE SULFATE 50 MG/ML IJ SOLN
INTRAMUSCULAR | Status: DC | PRN
  Administered 2014-10-24: 5 mg via INTRAVENOUS

## 2014-10-24 MED ORDER — LIDOCAINE HCL (CARDIAC) 20 MG/ML IV SOLN
INTRAVENOUS | Status: DC | PRN
  Administered 2014-10-24: 70 mg via INTRAVENOUS

## 2014-10-24 MED ORDER — GLYCOPYRROLATE 0.2 MG/ML IJ SOLN
INTRAMUSCULAR | Status: DC | PRN
  Administered 2014-10-24: .8 mg via INTRAVENOUS

## 2014-10-24 MED ORDER — MIDAZOLAM HCL 5 MG/5ML IJ SOLN
INTRAMUSCULAR | Status: DC | PRN
  Administered 2014-10-24: 2 mg via INTRAVENOUS

## 2014-10-24 MED ORDER — ARTIFICIAL TEARS OP OINT
TOPICAL_OINTMENT | OPHTHALMIC | Status: AC
Start: 2014-10-24 — End: 2014-10-24
  Filled 2014-10-24: qty 3.5

## 2014-10-24 MED ORDER — VECURONIUM BROMIDE 10 MG IV SOLR
INTRAVENOUS | Status: DC | PRN
  Administered 2014-10-24: 1 mg via INTRAVENOUS

## 2014-10-24 MED ORDER — GLYCOPYRROLATE 0.2 MG/ML IJ SOLN
INTRAMUSCULAR | Status: AC
Start: 2014-10-24 — End: 2014-10-24
  Filled 2014-10-24: qty 3

## 2014-10-24 MED ORDER — PHENYLEPHRINE 40 MCG/ML (10ML) SYRINGE FOR IV PUSH (FOR BLOOD PRESSURE SUPPORT)
PREFILLED_SYRINGE | INTRAVENOUS | Status: AC
  Filled 2014-10-24: qty 10

## 2014-10-24 MED ORDER — CHLORHEXIDINE GLUCONATE CLOTH 2 % EX PADS: 6.0000 | MEDICATED_PAD | Freq: Once | CUTANEOUS | Status: DC

## 2014-10-24 MED ORDER — NEOSTIGMINE METHYLSULFATE 10 MG/10ML IV SOLN
INTRAVENOUS | Status: AC
  Filled 2014-10-24: qty 1

## 2014-10-24 MED ORDER — PROPOFOL 10 MG/ML IV BOLUS
INTRAVENOUS | Status: DC | PRN
  Administered 2014-10-24: 180 mg via INTRAVENOUS

## 2014-10-24 MED ORDER — VECURONIUM BROMIDE 10 MG IV SOLR
INTRAVENOUS | Status: AC
  Filled 2014-10-24: qty 10

## 2014-10-24 MED ORDER — MIDAZOLAM HCL 2 MG/2ML IJ SOLN
INTRAMUSCULAR | Status: AC
  Filled 2014-10-24: qty 2

## 2014-10-24 MED ORDER — PHENYLEPHRINE HCL 10 MG/ML IJ SOLN
INTRAMUSCULAR | Status: DC | PRN
  Administered 2014-10-24: 80 ug via INTRAVENOUS
  Administered 2014-10-24 (×2): 40 ug via INTRAVENOUS

## 2014-10-24 MED ORDER — HYDROMORPHONE HCL 1 MG/ML IJ SOLN: 0.2500 mg | INTRAMUSCULAR | Status: DC | PRN

## 2014-10-24 MED ORDER — 0.9 % SODIUM CHLORIDE (POUR BTL) OPTIME
TOPICAL | Status: DC | PRN
  Administered 2014-10-24: 1000 mL

## 2014-10-24 MED ORDER — FENTANYL CITRATE (PF) 100 MCG/2ML IJ SOLN
INTRAMUSCULAR | Status: DC | PRN
  Administered 2014-10-24: 100 ug via INTRAVENOUS

## 2014-10-24 MED ORDER — FENTANYL CITRATE (PF) 250 MCG/5ML IJ SOLN
INTRAMUSCULAR | Status: AC
  Filled 2014-10-24: qty 5

## 2014-10-24 MED ORDER — ONDANSETRON HCL 4 MG/2ML IJ SOLN: 4.0000 mg | Freq: Four times a day (QID) | INTRAMUSCULAR | Status: DC | PRN

## 2014-10-24 MED ORDER — ROCURONIUM BROMIDE 50 MG/5ML IV SOLN
INTRAVENOUS | Status: AC
  Filled 2014-10-24: qty 1

## 2014-10-24 MED ORDER — STERILE WATER FOR INJECTION IJ SOLN
INTRAMUSCULAR | Status: AC
  Filled 2014-10-24: qty 10

## 2014-10-24 MED ORDER — LIDOCAINE HCL (CARDIAC) 20 MG/ML IV SOLN
INTRAVENOUS | Status: AC
  Filled 2014-10-24: qty 5

## 2014-10-24 MED ORDER — OXYCODONE HCL 5 MG PO TABS: 5.0000 mg | ORAL_TABLET | Freq: Once | ORAL | Status: DC | PRN

## 2014-10-24 MED ORDER — EPINEPHRINE HCL 1 MG/ML IJ SOLN
INTRAMUSCULAR | Status: DC | PRN
  Administered 2014-10-24: 1 mL

## 2014-10-24 MED ORDER — OXYCODONE HCL 5 MG/5ML PO SOLN: 5.0000 mg | Freq: Once | ORAL | Status: DC | PRN

## 2014-10-24 MED ORDER — LIDOCAINE HCL 4 % MT SOLN
OROMUCOSAL | Status: DC | PRN
  Administered 2014-10-24: 4 mL via TOPICAL

## 2014-10-24 SURGICAL SUPPLY — 66 items
APPLIER CLIP LOGIC TI 5 (MISCELLANEOUS) IMPLANT
BLADE SURG 15 STRL LF DISP TIS (BLADE) IMPLANT
BLADE SURG 15 STRL SS (BLADE)
BRUSH CYTOL CELLEBRITY 1.5X140 (MISCELLANEOUS) IMPLANT
CANISTER SUCTION 2500CC (MISCELLANEOUS) ×4 IMPLANT
CLIP TI MEDIUM 6 (CLIP) IMPLANT
CONT SPEC 4OZ CLIKSEAL STRL BL (MISCELLANEOUS) ×4 IMPLANT
COTTONBALL LRG STERILE PKG (GAUZE/BANDAGES/DRESSINGS) IMPLANT
COVER SURGICAL LIGHT HANDLE (MISCELLANEOUS) IMPLANT
COVER TABLE BACK 60X90 (DRAPES) ×4 IMPLANT
DERMABOND ADVANCED (GAUZE/BANDAGES/DRESSINGS)
DERMABOND ADVANCED .7 DNX12 (GAUZE/BANDAGES/DRESSINGS) IMPLANT
DRAPE CHEST BREAST 15X10 FENES (DRAPES) IMPLANT
ELECT REM PT RETURN 9FT ADLT (ELECTROSURGICAL) ×4
ELECTRODE REM PT RTRN 9FT ADLT (ELECTROSURGICAL) ×2 IMPLANT
FILTER STRAW FLUID ASPIR (MISCELLANEOUS) IMPLANT
FORCEPS BIOP RJ4 1.8 (CUTTING FORCEPS) ×4 IMPLANT
FORCEPS RADIAL JAW LRG 4 PULM (INSTRUMENTS) ×2 IMPLANT
GAUZE SPONGE 4X4 12PLY STRL (GAUZE/BANDAGES/DRESSINGS) ×4 IMPLANT
GAUZE SPONGE 4X4 16PLY XRAY LF (GAUZE/BANDAGES/DRESSINGS) ×4 IMPLANT
GLOVE SURG SIGNA 7.5 PF LTX (GLOVE) ×4 IMPLANT
GLOVE SURG SS PI 7.0 STRL IVOR (GLOVE) ×4 IMPLANT
GOWN STRL REUS W/ TWL LRG LVL3 (GOWN DISPOSABLE) ×2 IMPLANT
GOWN STRL REUS W/ TWL XL LVL3 (GOWN DISPOSABLE) ×2 IMPLANT
GOWN STRL REUS W/TWL LRG LVL3 (GOWN DISPOSABLE) ×2
GOWN STRL REUS W/TWL XL LVL3 (GOWN DISPOSABLE) ×2
HEMOSTAT SURGICEL 2X14 (HEMOSTASIS) IMPLANT
KIT BASIN OR (CUSTOM PROCEDURE TRAY) IMPLANT
KIT CLEAN ENDO COMPLIANCE (KITS) ×4 IMPLANT
KIT ROOM TURNOVER OR (KITS) ×4 IMPLANT
MARKER SKIN DUAL TIP RULER LAB (MISCELLANEOUS) ×4 IMPLANT
NEEDLE 22X1 1/2 (OR ONLY) (NEEDLE) IMPLANT
NEEDLE BIOPSY TRANSBRONCH 21G (NEEDLE) IMPLANT
NEEDLE BLUNT 18X1 FOR OR ONLY (NEEDLE) IMPLANT
NEEDLE SONO TIP II EBUS (NEEDLE) ×4 IMPLANT
NS IRRIG 1000ML POUR BTL (IV SOLUTION) ×4 IMPLANT
OIL SILICONE PENTAX (PARTS (SERVICE/REPAIRS)) ×4 IMPLANT
PACK SURGICAL SETUP 50X90 (CUSTOM PROCEDURE TRAY) ×4 IMPLANT
PAD ARMBOARD 7.5X6 YLW CONV (MISCELLANEOUS) ×8 IMPLANT
PENCIL BUTTON HOLSTER BLD 10FT (ELECTRODE) ×4 IMPLANT
RADIAL JAW LRG 4 PULMONARY (INSTRUMENTS) ×2
SPONGE INTESTINAL PEANUT (DISPOSABLE) IMPLANT
SUT SILK 2 0 (SUTURE)
SUT SILK 2-0 18XBRD TIE 12 (SUTURE) IMPLANT
SUT VIC AB 2-0 CT1 27 (SUTURE)
SUT VIC AB 2-0 CT1 TAPERPNT 27 (SUTURE) IMPLANT
SUT VIC AB 3-0 SH 18 (SUTURE) IMPLANT
SUT VIC AB 3-0 SH 27 (SUTURE)
SUT VIC AB 3-0 SH 27X BRD (SUTURE) IMPLANT
SUT VICRYL 4-0 PS2 18IN ABS (SUTURE) IMPLANT
SWAB COLLECTION DEVICE MRSA (MISCELLANEOUS) IMPLANT
SYR 20CC LL (SYRINGE) ×4 IMPLANT
SYR 20ML ECCENTRIC (SYRINGE) ×4 IMPLANT
SYR 5ML LL (SYRINGE) ×4 IMPLANT
SYR 5ML LUER SLIP (SYRINGE) ×4 IMPLANT
SYR CONTROL 10ML LL (SYRINGE) IMPLANT
SYRINGE 10CC LL (SYRINGE) ×4 IMPLANT
TOWEL OR 17X24 6PK STRL BLUE (TOWEL DISPOSABLE) ×8 IMPLANT
TOWEL OR 17X26 10 PK STRL BLUE (TOWEL DISPOSABLE) ×4 IMPLANT
TRAP SPECIMEN MUCOUS 40CC (MISCELLANEOUS) ×4 IMPLANT
TUBE ANAEROBIC SPECIMEN COL (MISCELLANEOUS) IMPLANT
TUBE CONNECTING 12'X1/4 (SUCTIONS) ×1
TUBE CONNECTING 12X1/4 (SUCTIONS) ×3 IMPLANT
TUBE CONNECTING 20'X1/4 (TUBING) ×1
TUBE CONNECTING 20X1/4 (TUBING) ×3 IMPLANT
WATER STERILE IRR 1000ML POUR (IV SOLUTION) ×4 IMPLANT

## 2014-10-24 NOTE — Brief Op Note (Signed)
10/24/2014  1:28 PM  PATIENT:  Alejandro [name]  74 y.o. male  PRE-OPERATIVE DIAGNOSIS:  RIGHT UPPER LOBE MASS WITH MEDIASTINAL ADENOPATHY  POST-OPERATIVE DIAGNOSIS:  SQUAMOUS CELL CARCINOMA- CLINICAL STAGE IIIB  PROCEDURE:  Procedure(s): VIDEO BRONCHOSCOPY WITH ENDOBRONCHIAL ULTRASOUND (N/A)  SURGEON:  Surgeon(s) and Role:    * Melrose Nakayama, MD - Primary   ANESTHESIA:   general  EBL:  Total I/O In: 1000 [I.V.:1000] Out: -   BLOOD ADMINISTERED:none  DRAINS: none   LOCAL MEDICATIONS USED:  NONE  SPECIMEN:  Source of Specimen:  4R and 7 lymph nodes/ RUL mass  DISPOSITION OF SPECIMEN:  PATHOLOGY  PLAN OF CARE: Discharge to home after PACU  PATIENT DISPOSITION:  PACU - hemodynamically stable.   Delay start of Pharmacological VTE agent (>24hrs) due to surgical blood loss or risk of bleeding: not applicable  FINDINGS: Markedly enlarged level 4R and 7 lymph nodes- quick prep necrosis, no definite tumor. Mass obstructing RUL orifice- biopsy showed squamous cell carcinoma.

## 2014-10-24 NOTE — Transfer of Care (Signed)
Immediate Anesthesia Transfer of Care Note  Patient: Alejandro [name]  Procedure(s) Performed: Procedure(s): VIDEO BRONCHOSCOPY WITH ENDOBRONCHIAL ULTRASOUND (N/A)  Patient Location: PACU  Anesthesia Type:General  Level of Consciousness: awake, alert  and oriented  Airway & Oxygen Therapy: Patient Spontanous Breathing and Patient connected to nasal cannula oxygen  Post-op Assessment: Report given to RN and Post -op Vital signs reviewed and stable  Post vital signs: Reviewed and stable  Last Vitals:  Filed Vitals:   10/24/14 0954  BP: 129/101  Pulse: 43  Temp: 36.5 C  Resp: 20    Complications: No apparent anesthesia complications

## 2014-10-24 NOTE — Anesthesia Postprocedure Evaluation (Signed)
Anesthesia Post Note  Patient: Alejandro [name]  Procedure(s) Performed: Procedure(s) (LRB): VIDEO BRONCHOSCOPY WITH ENDOBRONCHIAL ULTRASOUND (N/A)  Anesthesia type: General  Patient location: PACU  Post pain: Pain level controlled and Adequate analgesia  Post assessment: Post-op Vital signs reviewed, Patient's Cardiovascular Status Stable, Respiratory Function Stable, Patent Airway and Pain level controlled  Last Vitals:  Filed Vitals:   10/24/14 1415  BP: 93/54  Pulse: 77  Temp:   Resp: 38    Post vital signs: Reviewed and stable  Level of consciousness: awake, alert  and oriented  Complications: No apparent anesthesia complications

## 2014-10-24 NOTE — Discharge Instructions (Signed)
Do not drive or engage in heavy physical activity for 24 hours  You may resume normal activities tomorrow  You may cough up small amounts of blood over the next few days.  Call [phone] if you develop chest pain, shortness of breath, fever > 101 or cough up more than 1/4 cup of blood  Follow up with Dr. Whitney Muse and Robynn Pane as scheduled

## 2014-10-24 NOTE — Anesthesia Preprocedure Evaluation (Signed)
Anesthesia Evaluation  Patient identified by MRN, date of birth, ID band Patient awake    Reviewed: Allergy & Precautions, NPO status , Patient's Chart, lab work & pertinent test results  Airway Mallampati: II   Neck ROM: full    Dental   Pulmonary former smoker,  breath sounds clear to auscultation        Cardiovascular hypertension, Rhythm:regular Rate:Normal     Neuro/Psych PSYCHIATRIC DISORDERS Schizophrenia    GI/Hepatic   Endo/Other  diabetes, Type 2obese  Renal/GU      Musculoskeletal   Abdominal   Peds  Hematology   Anesthesia Other Findings   Reproductive/Obstetrics                             Anesthesia Physical Anesthesia Plan  ASA: II  Anesthesia Plan: General   Post-op Pain Management:    Induction: Intravenous  Airway Management Planned: Oral ETT  Additional Equipment:   Intra-op Plan:   Post-operative Plan: Extubation in OR  Informed Consent: I have reviewed the patients History and Physical, chart, labs and discussed the procedure including the risks, benefits and alternatives for the proposed anesthesia with the patient or authorized representative who has indicated his/her understanding and acceptance.     Plan Discussed with: CRNA, Anesthesiologist and Surgeon  Anesthesia Plan Comments:         Anesthesia Quick Evaluation

## 2014-10-24 NOTE — Interval H&P Note (Signed)
History and Physical Interval Note:  10/24/2014 11:04 AM  Alejandro [name]  has presented today for surgery, with the diagnosis of RUL MASS MEDIASTINAL ADENOPATHY  The various methods of treatment have been discussed with the patient and family. After consideration of risks, benefits and other options for treatment, the patient has consented to  Procedure(s): VIDEO BRONCHOSCOPY WITH ENDOBRONCHIAL ULTRASOUND (N/A) POSSIBLE MEDIASTINOSCOPY (N/A) as a surgical intervention .  The patient's history has been reviewed, patient examined, no change in status, stable for surgery.  I have reviewed the patient's chart and labs.  Questions were answered to the patient's satisfaction.     Melrose Nakayama

## 2014-10-24 NOTE — Anesthesia Procedure Notes (Signed)
Procedure Name: Intubation Performed by: Mariea Clonts Pre-anesthesia Checklist: Patient identified, Timeout performed, Emergency Drugs available, Suction available and Patient being monitored Patient Re-evaluated:Patient Re-evaluated prior to inductionOxygen Delivery Method: Circle system utilized Preoxygenation: Pre-oxygenation with 100% oxygen Intubation Type: IV induction Ventilation: Mask ventilation without difficulty and Oral airway inserted - appropriate to patient size Laryngoscope Size: Mac and 3 Grade View: Grade I Tube type: Oral Tube size: 8.5 mm Number of attempts: 1 Airway Equipment and Method: Stylet Placement Confirmation: ETT inserted through vocal cords under direct vision,  positive ETCO2,  CO2 detector and breath sounds checked- equal and bilateral Secured at: 23 cm Tube secured with: Tape Dental Injury: Teeth and Oropharynx as per pre-operative assessment

## 2014-10-24 NOTE — H&P (View-Only) (Signed)
PCP is ROBERTSON, Thomasena Edis, PA-C Referring Provider is Baird Cancer, PA-C  Chief Complaint  Patient presents with  . Lung Mass    Surgical eval, CTA Chest 09/30/14, PET Scan 10/10/14    HPI: 74 year old gentleman sent for consultation regarding a right lung mass with mediastinal adenopathy.  Mr. Goerner is a poor historian. He is accompanied by his caretaker from his home.  Mr. Ewen is a 74 year old man with a history of schizophrenia, hypertension, hyperlipidemia, type 2 diabetes, frequent PVCs, positive PPD, and gastrointestinal bleeding. His primary complaint recently has been chest pain. This is a right sided pain that is worsened by coughing. He was admitted to [facility] and during that evaluation had a CT scan which showed a right upper lobe mass and mediastinal adenopathy. He was referred to Dr. Chase Caller also saw Robynn Pane at [facility].  He denies shortness of breath, but has very limited exercise tolerance on testing at the pulmonology office. He does complain of chest pain. He has a nonproductive cough. He quit smoking 2 weeks ago. He denies any weight loss and says he has gained 10 pounds in the last 3 months.   Past Medical History  Diagnosis Date  . Mixed hyperlipidemia   . Essential hypertension, benign   . Schizophrenia   . Positive PPD     Treated  . DM2 (diabetes mellitus, type 2)   . Diverticulosis     Pancolonic   . Hemorrhoids   . PVC's (premature ventricular contractions)   . Syncope   . Mentally challenged   . History of GI diverticular bleed   . Lung mass     Past Surgical History  Procedure Laterality Date  . Colonoscopy   07/11/2004     Rehman-Pancolonic diverticulosis/Small external hemorrhoids/ The colon was full of coffee-ground material, but no active bleeding  . Esophagogastroduodenoscopy  07/11/2004    Rehman-incomplete ring GEJ, Small sliding hiatal hernia/Bulbar duodenitis without stigmata of bleeding  . Givens capsule study   01/10/2005    Rehman-Few petechiae noted at involving gastric mucosa as well as duodenum and  jejunum/ the  surface of the small bowel mucosa could not be seen because of food   debris limiting the quality of this study.  . Colonoscopy  03/29/2008    Large mouth ascending colon and sigmoid colon diverticula which were frequent.  Otherwise, no polyps, masses, inflammatory changes or AVM seen. /Small internal hemorrhoids, otherwise normal retroflexed view of the rectum  . Arm surgery      left  . Esophagogastroduodenoscopy (egd) with esophageal dilation  04/17/2012    ZOX:WRUEAVWU ZENKER'S DIVERTICULUM OR PRIMARY ESOPHGEAL MOILITY DISORDER/MODERATE Erosive gastritis/MILD Duodenal inflammation     Family History  Problem Relation Age of Onset  . Stroke Father   . Stroke Mother   . CAD Mother     Social History History  Substance Use Topics  . Smoking status: Former Smoker -- 1.50 packs/day for 40 years    Types: Cigarettes    Quit date: 10/03/2014  . Smokeless tobacco: Former Systems developer    Quit date: 10/07/2014     Comment: Pt smoked 1.5 ppd for 40 years then 0.5ppd for 10 more years.   . Alcohol Use: No    Current Outpatient Prescriptions  Medication Sig Dispense Refill  . amLODipine (NORVASC) 5 MG tablet Take 5 mg by mouth daily.      Marland Kitchen aspirin (ASPIR-LOW) 81 MG EC tablet Take 81 mg by mouth daily.      Marland Kitchen  Cholecalciferol (VITAMIN D3) 2000 UNITS TABS Take 2 tablets by mouth daily.    Marland Kitchen dutasteride (AVODART) 0.5 MG capsule Take 0.5 mg by mouth daily.      Marland Kitchen econazole nitrate 1 % cream Apply 1 application topically 2 (two) times daily.     . fish oil-omega-3 fatty acids 1000 MG capsule Take 1 g by mouth 2 (two) times daily.    . fluticasone (FLONASE) 50 MCG/ACT nasal spray Place 1 spray into the nose daily.     Marland Kitchen lisinopril-hydrochlorothiazide (PRINZIDE,ZESTORETIC) 20-12.5 MG per tablet Take 1 tablet by mouth daily.     Marland Kitchen lovastatin (MEVACOR) 40 MG tablet Take 40 mg by mouth at bedtime.      . meloxicam (MOBIC) 15 MG tablet Take 15 mg by mouth daily as needed for pain.     . metFORMIN (GLUCOPHAGE) 500 MG tablet Take 500 mg by mouth 2 (two) times daily with a meal.     . metoprolol succinate (TOPROL XL) 25 MG 24 hr tablet Take 25 mg by mouth every morning.     . Multiple Vitamins-Minerals (MULTIVITAMIN WITH MINERALS) tablet Take 1 tablet by mouth daily.    Marland Kitchen omeprazole (PRILOSEC) 20 MG capsule Take 20 mg by mouth every morning. 1 po every morning    . polyethylene glycol (MIRALAX / GLYCOLAX) packet Take 17 g by mouth See admin instructions. Mix 1 capful (17 gm) in 8 oz of water twice daily until regular bowel movements occur, then start once daily as needed for constipation.    . risperiDONE (RISPERDAL) 3 MG tablet Take 3 mg by mouth 2 (two) times daily.     . Tamsulosin HCl (FLOMAX) 0.4 MG CAPS Take 0.4 mg by mouth daily.     . traZODone (DESYREL) 150 MG tablet Take 150 mg by mouth at bedtime.      No current facility-administered medications for this visit.    No Known Allergies  Review of Systems  Constitutional: Positive for unexpected weight change (Weight gain 10 pounds in 3 months).  HENT:       Hoarse  Respiratory: Positive for cough (nnonproductive, denies hemoptysis) and shortness of breath (Mild with exertion).   Cardiovascular: Positive for chest pain (right-sided chest pain worsened with coughing).  Neurological: Negative.   Hematological: Does not bruise/bleed easily.  All other systems reviewed and are negative.   BP 115/77 mmHg  Pulse 77  Resp 20  Ht '5\' 6"'$  (1.676 m)  Wt 197 lb (89.359 kg)  BMI 31.81 kg/m2  SpO2 96% Physical Exam  Constitutional: He appears well-developed and well-nourished. No distress.  HENT:  Head: Normocephalic and atraumatic.  Eyes: EOM are normal.  Neck: Neck supple. No thyromegaly present.  Cardiovascular: Normal heart sounds and intact distal pulses.   No murmur heard. Irregular rhythm  Pulmonary/Chest: Effort normal. He  has no wheezes. He has no rales.  Abdominal: Soft. There is no tenderness.  Musculoskeletal: He exhibits no edema.  Lymphadenopathy:    He has no cervical adenopathy.  Neurological: He is alert. No cranial nerve deficit.  Skin: Skin is warm and dry.  Psychiatric:  Flat affect  Vitals reviewed.    Diagnostic Tests: NUCLEAR MEDICINE PET SKULL BASE TO THIGH  TECHNIQUE: 9.76 mCi F-18 FDG was injected intravenously. Full-ring PET imaging was performed from the skull base to thigh after the radiotracer. CT data was obtained and used for attenuation correction and anatomic localization.  FASTING BLOOD GLUCOSE: Value: 91 mg/dl  COMPARISON: Chest CTs 4/1  and 09/30/2014  FINDINGS: NECK  No hypermetabolic lymph nodes in the neck.  CHEST  The bilobed right apical lung mass is hypermetabolic with SUV max of 30.8. Surrounding lung disease could be obstructive pneumonitis. There is bulky hypermetabolic right hilar and mediastinal lymphadenopathy with SUV max of 23.4. There are also hypermetabolic anterior mediastinal lymph nodes with SUV max of 8.8. No contralateral adenopathy. No other worrisome pulmonary lesions. 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  ABDOMEN/PELVIS  18 mm left adrenal gland nodule is mildly hypermetabolic with SUV max 2.4. This measures 32 Hounsfield units and is most likely a metastasis.  No hepatic metastatic disease is demonstrated. There is a contracted stone filled gallbladder.  Simple appearing bilateral renal cysts. No abdominal/ pelvic lymphadenopathy.  SKELETON  No focal hypermetabolic activity to suggest skeletal metastasis.  IMPRESSION: 1. Hypermetabolic right apical lung mass consistent with primary lung neoplasm. Surrounding inflammatory changes is likely obstructive pneumonitis. 2. Bulky metastatic right hilar and mediastinal lymphadenopathy. 3. 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   Electronically Signed   By: Marijo Sanes M.D.  On: 10/10/2014 14:24   Impression: 74 year old man with a history of heavy tobacco abuse and numerous other psychiatric and medical issues. He has a right upper lobe mass and extensive hilar mediastinal adenopathy. There also is possibly a left adrenal metastasis. This is highly suspicious for small cell carcinoma. He does have a history of a positive PPD in the past, but that was treated. I do not think this is infectious in nature.  I discussed the case with Dr. Chase Caller yesterday. We felt it was imperative to get a biopsy as quickly as possible. We also are both concerned that bronchoscopic and endobronchial ultrasound biopsies could be nondiagnostic. We feel it would be best if this was done in the OR setting so that we could proceed with mediastinoscopy if necessary to make a definitive diagnosis.  I discussed the proposed procedure with Mr. Messler and his caretaker. We would plan to do a bronchoscopy, endobronchial ultrasound, and possible mediastinal endoscopy. I reviewed the indications, risks, benefits, and alternatives. We would do this as an outpatient procedure. We would only do mediastinal endoscopy if both bronchoscopy and EBUS were nondiagnostic. He does understand that if mediastinal endoscopy is necessary it is a surgical procedure involving an incision. I reviewed the risks which include but are not limited to death, MI, DVT, PE, bleeding, possible need for transfusion, infection, pneumothorax, failure to make a diagnosis, as well as the possibility of unforeseeable complications.  Plan: Bronchoscopy, endobronchial ultrasound, possible mediastinal endoscopy on Monday, 10/24/2014  Melrose Nakayama, MD [facility] [phone]

## 2014-10-25 NOTE — Op Note (Signed)
NAMEDIMITRY, [place]OLSWORTH                 ACCOUNT NO.:  [id number]  MEDICAL RECORD NO.:  [id number]  LOCATION:  [facility]                         FACILITY:  [facility]  PHYSICIAN:  Revonda Standard. Roxan Hockey, M.D.DATE OF BIRTH:  20-Dec-1940  DATE OF PROCEDURE:  10/24/2014 DATE OF DISCHARGE:  10/24/2014                              OPERATIVE REPORT   PREOPERATIVE DIAGNOSIS:  Right upper lobe mass with mediastinal adenopathy.  POSTOPERATIVE DIAGNOSIS:  Squamous cell carcinoma, clinical stage IIIB.  PROCEDURE:  Bronchoscopy with biopsies and endobronchial ultrasound with aspiration of mediastinal lymph nodes.  SURGEON:  Revonda Standard. Roxan Hockey, M.D.  ASSISTANT:  None.  ANESTHESIA:  General.  FINDINGS:  Markedly enlarged 4R and 7 lymph nodes.  Aspirations revealed extensive necrosis. Endobronchial mass obstructing the right upper lobe orifice with small nodule in right main stem adjacent to the mass. Nodule from right mainstem showed squamous cells, but no definite cancer.  Biopsy of the right upper lobe mass showed squamous cell carcinoma.  CLINICAL NOTE:  Mr. Duque is a 74 year old man with a history of tobacco abuse who was recently complaining of right-sided chest pain worsened by coughing.  He was admitted to [facility] and ruled out for myocardial infarction, but CT showed a right upper lobe mass and mediastinal adenopathy.  This was suspicious for a stage IIIB lung cancer.  He was referred for diagnostic evaluation.  He was advised to undergo bronchoscopy and endobronchial ultrasound with possible mediastinoscopy if needed to establish a definitive diagnosis.  The indications, risks, benefits, alternatives, and intraoperative decision making were discussed with the patient.  He understood the risks, accepted them, and agreed to proceed.  OPERATIVE NOTE:  Mr. Smisek was brought to the operating room on Oct 24, 2014.  He had induction of general endotracheal anesthesia.  Flexible fiberoptic  bronchoscopy was performed via the endotracheal tube.  There was normal endobronchial anatomy on the left side.  On the right, there was an endobronchial mass obstructing the right upper lobe orifice.  There was a small nodule on the right main stem adjacent to the right upper lobe mass.  There was extensive edema in the mucosa of the right mainstem surrounding the right upper lobe takeoff.  The bronchus intermedius and lower and middle lobe bronchi were normal.  The endobronchial ultrasound probe was placed. Markedly enlarged level 4R and 7 lymph nodes were easily identified, and each of these were aspirated twice.  With each aspiration, the needle was advanced into the node with ultrasound visualization and then 10 passes were made with suction applied.  Specimen was placed on slides as well as in cytologic fluid.  After performing these aspirations, the endobronchial ultrasound probe was removed.  The bronchoscope was reinserted.  The nodule in the right mainstem was biopsied and sent for frozen section.  While awaiting the results of the cytologies and the frozen section, multiple biopsies were taken of the mass in the right upper lobe bronchus.  There was bleeding, which was controlled with dilute epinephrine.  Despite taking a large number of biopsies and debriding the mass significantly, no airway could be reestablished into the right upper lobe.  The quick prep on the lymph  nodes revealed necrosis, but no definite tumor.  The frozen section on the right main stem nodule showed squamous cells with some atypia, but no definite tumor there either.  The first right upper lobe biopsies were sent for frozen section and while awaiting that result, additional biopsies were taken.  The frozen section on the right upper lobe mass showed squamous cell carcinoma.  A final inspection was made for hemostasis.  There was no ongoing bleeding.  As previously noted, the right upper lobe segmental  airways could not be reopened.  The bronchoscope was removed.  The patient was extubated in the operating room, and taken to the postanesthetic care unit in good condition.     Revonda Standard Roxan Hockey, M.D.     SCH/MEDQ  D:  10/24/2014  T:  10/25/2014  Job:  [id number]

## 2014-10-26 ENCOUNTER — Encounter (HOSPITAL_COMMUNITY): Payer: Self-pay | Admitting: Thoracic Surgery (Cardiothoracic Vascular Surgery)

## 2014-10-28 ENCOUNTER — Encounter (HOSPITAL_COMMUNITY): Payer: Self-pay | Admitting: Hematology & Oncology

## 2014-10-28 ENCOUNTER — Encounter (HOSPITAL_COMMUNITY): Payer: Medicare Other | Attending: Oncology | Admitting: Hematology & Oncology

## 2014-10-28 VITALS — BP 140/88 | HR 66 | Temp 97.7°F | Resp 18 | Wt 198.4 lb

## 2014-10-28 DIAGNOSIS — C3491 Malignant neoplasm of unspecified part of right bronchus or lung: Secondary | ICD-10-CM

## 2014-10-28 DIAGNOSIS — R918 Other nonspecific abnormal finding of lung field: Secondary | ICD-10-CM | POA: Insufficient documentation

## 2014-10-28 NOTE — Progress Notes (Signed)
[facility] Hematology/Oncology Consultation   Name: Alejandro [name]      MRN: [id number]    Location: Room/bed info not found  Date: 10/28/2014 Time:9:39 AM   REFERRING PHYSICIAN:  Velta Addison Mikhail, DO  REASON FOR CONSULT:  Right apical lung mass with hlar and mediastinal lymphdenopathy   DIAGNOSIS:  Un-biopsied lung mass, right apical, with hilar and mediastinal lymphadenopathy, suspicious for primary bronchogenic lung cancer  Bronchoscopy, endobronchial ultrasound and aspiration of mediastinal lymph nodes with final pathology Squamous cell carcinoma  PET/CT 10/10/2014 with hypermetabolic R apical lung mass c/w primary lung neoplasm, bulky right hilar and mediastinal LAD, suspect L adrenal metastases  HISTORY OF PRESENT ILLNESS:   Alejandro [name] is a 74 year old black American who was recently admitted to the [facility] on 09/30/2014 for chest pain with a work-up that revealed a quick growing right apical mass with history of outpatient follow-up regarding initial finding of right apical mass.    On 09/09/2014, the patient underwent a CT of chest demonstrating a 2 cm right apical nodule concerning for lung cancer with large right hilar mass and/or adenopathy, adjacent mediastinal adenopathy, 4 cm right thyroid mass, and left adrenal nodule (possible metastasis).  This test was ordered by Karna Dupes, PA-C.    Alejandro [name] lives in an adult group home with a past medical history significant for tobacco abuse, schizophrenia, distant history of cocaine abuse, and 8- 9th grade education.  He admits dyspnea of exertion, rare instance of hemoptysis, decreased appetite, and an 8 lb weight loss (unintentional) over a 3 month time span.  He has been living in the group home for approximately 20 years.    Pathology and imaging studies were reviewed with the patient and family.  He has a suspicious Left adrenal mass that would make him stage IV, although only a single metastatic  site.   He denies change in appetite or energy level. He denies cough or SOB. He continues to smoke   PAST MEDICAL HISTORY:    Past Medical History  Diagnosis Date  . Mixed hyperlipidemia   . Essential hypertension, benign   . Schizophrenia   . Positive PPD     Treated  . DM2 (diabetes mellitus, type 2)   . Diverticulosis     Pancolonic   . Hemorrhoids   . PVC's (premature ventricular contractions)   . Syncope   . Mentally challenged   . History of GI diverticular bleed   . Lung mass     ALLERGIES: No Known Allergies    MEDICATIONS: I have reviewed the patient's current medications.    Current Outpatient Prescriptions on File Prior to Visit  Medication Sig Dispense Refill  . amLODipine (NORVASC) 5 MG tablet Take 5 mg by mouth daily.      Marland Kitchen aspirin (ASPIR-LOW) 81 MG EC tablet Take 81 mg by mouth daily.      . Cholecalciferol (VITAMIN D3) 2000 UNITS TABS Take 2 tablets by mouth daily.    Marland Kitchen dutasteride (AVODART) 0.5 MG capsule Take 0.5 mg by mouth daily.      Marland Kitchen econazole nitrate 1 % cream Apply 1 application topically 2 (two) times daily.     . fish oil-omega-3 fatty acids 1000 MG capsule Take 1 g by mouth 2 (two) times daily.    . fluticasone (FLONASE) 50 MCG/ACT nasal spray Place 1 spray into the nose daily.     Marland Kitchen lisinopril-hydrochlorothiazide (PRINZIDE,ZESTORETIC) 20-12.5 MG per  tablet Take 1 tablet by mouth daily.     Marland Kitchen lovastatin (MEVACOR) 40 MG tablet Take 40 mg by mouth at bedtime.     . metFORMIN (GLUCOPHAGE) 500 MG tablet Take 500 mg by mouth 2 (two) times daily with a meal.     . metoprolol succinate (TOPROL XL) 25 MG 24 hr tablet Take 25 mg by mouth every morning.     . Multiple Vitamins-Minerals (MULTIVITAMIN WITH MINERALS) tablet Take 1 tablet by mouth daily.    Marland Kitchen omeprazole (PRILOSEC) 20 MG capsule Take 20 mg by mouth every morning. 1 po every morning    . risperiDONE (RISPERDAL) 3 MG tablet Take 3 mg by mouth 2 (two) times daily.     . Tamsulosin HCl  (FLOMAX) 0.4 MG CAPS Take 0.4 mg by mouth daily.     . traZODone (DESYREL) 150 MG tablet Take 150 mg by mouth at bedtime.     . meloxicam (MOBIC) 15 MG tablet Take 15 mg by mouth daily as needed for pain.     . polyethylene glycol (MIRALAX / GLYCOLAX) packet Take 17 g by mouth See admin instructions. Mix 1 capful (17 gm) in 8 oz of water twice daily until regular bowel movements occur, then start once daily as needed for constipation.     No current facility-administered medications on file prior to visit.      PAST SURGICAL HISTORY  Past Surgical History  Procedure Laterality Date  . Colonoscopy   07/11/2004     Rehman-Pancolonic diverticulosis/Small external hemorrhoids/ The colon was full of coffee-ground material, but no active bleeding  . Esophagogastroduodenoscopy  07/11/2004    Rehman-incomplete ring GEJ, Small sliding hiatal hernia/Bulbar duodenitis without stigmata of bleeding  . Givens capsule study  01/10/2005    Rehman-Few petechiae noted at involving gastric mucosa as well as duodenum and  jejunum/ the  surface of the small bowel mucosa could not be seen because of food   debris limiting the quality of this study.  . Colonoscopy  03/29/2008    Large mouth ascending colon and sigmoid colon diverticula which were frequent.  Otherwise, no polyps, masses, inflammatory changes or AVM seen. /Small internal hemorrhoids, otherwise normal retroflexed view of the rectum  . Arm surgery      left  . Esophagogastroduodenoscopy (egd) with esophageal dilation  04/17/2012    VCB:[id number] ZENKER'S DIVERTICULUM OR PRIMARY ESOPHGEAL MOILITY DISORDER/MODERATE Erosive gastritis/MILD Duodenal inflammation   . Video bronchoscopy with endobronchial ultrasound N/A 10/24/2014    Procedure: VIDEO BRONCHOSCOPY WITH ENDOBRONCHIAL ULTRASOUND;  Surgeon: Melrose Nakayama, MD;  Location: [place];  Service: Thoracic;  Laterality: N/A;    FAMILY HISTORY: Family History  Problem Relation Age of Onset  .  Stroke Father   . Stroke Mother   . CAD Mother     SOCIAL HISTORY:  reports that he quit smoking about 3 weeks ago. His smoking use included Cigarettes. He has a 60 pack-year smoking history. He quit smokeless tobacco use about 3 weeks ago. He reports that he does not drink alcohol or use illicit drugs. Yet, has noted cigarette use since quitting.  14 point review of systems was performed and is negative except as detailed under history of present illness    PERFORMANCE STATUS: The patient's performance status is 0 - Asymptomatic  PHYSICAL EXAM: Most Recent Vital Signs: Blood pressure 140/88, pulse 66, temperature 97.7 F (36.5 C), temperature source Oral, resp. rate 18, weight 198 lb 6.4 oz (89.994 kg), SpO2 100 %.  General appearance: alert, cooperative, no distress, moderately obese, slowed mentation and appears younger than stated age Head: Normocephalic, without obvious abnormality, atraumatic Eyes: negative findings: lids and lashes normal, conjunctivae and sclerae normal, corneas clear and pupils equal, round, reactive to light and accomodation Throat: normal findings: lips normal without lesions, buccal mucosa normal, soft palate, uvula, and tonsils normal and oropharynx pink & moist without lesions or evidence of thrush and abnormal findings: dentition: upper and lower dentures Neck: no adenopathy, supple, symmetrical, trachea midline and thyroid not enlarged, symmetric, no tenderness/mass/nodules Back: symmetric, no curvature. ROM normal. No CVA tenderness. Lungs: clear to auscultation bilaterally and normal percussion bilaterally Heart: regularly irregular rhythm with a 3/1 beat and systolic ejection murmur Abdomen: soft, non-tender; bowel sounds normal; no masses,  no organomegaly Extremities: extremities normal, atraumatic, no cyanosis or edema Pulses: 2+ and symmetric Skin: Skin color, texture, turgor normal. No rashes or lesions Lymph nodes: Cervical, supraclavicular, and  axillary nodes normal. Neurologic: Alert and oriented X 3. Normal coordination and gait  LABORATORY DATA:  No results found for this or any previous visit (from the past 48 hour(s)).      RADIOGRAPHY:  EXAM: NUCLEAR MEDICINE PET SKULL BASE TO THIGH  TECHNIQUE: 9.76 mCi F-18 FDG was injected intravenously. Full-ring PET imaging was performed from the skull base to thigh after the radiotracer. CT data was obtained and used for attenuation correction and anatomic localization.  FASTING BLOOD GLUCOSE: Value: 91 mg/dl  COMPARISON: Chest CTs 4/1 and 09/30/2014  FINDINGS: NECK  No hypermetabolic lymph nodes in the neck.  CHEST  The bilobed right apical lung mass is hypermetabolic with SUV max of 45.4. Surrounding lung disease could be obstructive pneumonitis. There is bulky hypermetabolic right hilar and mediastinal lymphadenopathy with SUV max of 23.4. There are also hypermetabolic anterior mediastinal lymph nodes with SUV max of 8.8. No contralateral adenopathy. No other worrisome pulmonary lesions. 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  ABDOMEN/PELVIS  18 mm left adrenal gland nodule is mildly hypermetabolic with SUV max 2.4. This measures 32 Hounsfield units and is most likely a metastasis.  No hepatic metastatic disease is demonstrated. There is a contracted stone filled gallbladder.  Simple appearing bilateral renal cysts. No abdominal/ pelvic lymphadenopathy.  SKELETON  No focal hypermetabolic activity to suggest skeletal metastasis.  IMPRESSION: 1. Hypermetabolic right apical lung mass consistent with primary lung neoplasm. Surrounding inflammatory changes is likely obstructive pneumonitis. 2. Bulky metastatic right hilar and mediastinal lymphadenopathy. 3. 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   Electronically Signed  By: Marijo Sanes M.D.  On: 10/10/2014 14:24    PATHOLOGY:  None   ASSESSMENT:  1. 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 Tobacco abuse, at least 40 pack years.  Quit on 09/29/2014. 3. Schizophrenia, controlled 4. PVCs 5. Regularly irregular with a 3:1 pattern   Patient Active Problem List   Diagnosis Date Noted  . Mediastinal adenopathy 10/17/2014  . Dyspnea and respiratory abnormality 10/17/2014  . Lung mass 09/30/2014  . Chest pain 09/30/2014  . Diabetes mellitus type 2, controlled 09/30/2014  . Constipation 10/20/2013  . History of syncope 02/16/2013  . PVC's (premature ventricular contractions) 02/16/2013  . Colon cancer screening 09/16/2012  . Schizophrenia 06/01/2012  . Diabetes mellitus 06/01/2012  . Hyperlipidemia 06/01/2012  . Diverticulosis 06/01/2012  . Hepatic steatosis 06/01/2012  . Dysphagia 04/01/2012  . HYPERTENSION, BENIGN ESSENTIAL 11/07/2009     PLAN:  Recommended proceeding as follows: We  should try to biopsy the adrenal mass. If it is biopsy-proven disease that upstages him to stage IV. However he may still be a candidate for radiation to the chest and to the solitary adrenal metastases. This will be something that will need to be addressed with radiation oncology. Incorporation of radiation into his treatment plan will change my choice of drugs from carboplatin and Gemzar to cisplatin and etoposide.  I spent a great deal of time discussing with the family the different types of non-small cell lung cancer and staging of lung cancer. We discussed prognosis. We discussed side effects of treatment. A total of 40 minutes was spent in direct patient and familyconsultation and advisement.  We discussed smoking cessation in detail with the patient. He is at least willing to consider smoking cessation.  We will try to arrange for port placement and adrenal biopsy on the same day given the patient's schizophrenia. I will discuss his case with radiation oncology in regards to their  opinion, they are waiting for the final pathology of the adrenal biopsy as well.  All questions were answered. The patient knows to call the clinic with any problems, questions, or concerns. We can certainly see the patient much sooner if necessary.  This note is electronically signed by:    This document serves as a record of services personally performed by Ancil Linsey, MD. It was created on her behalf by Pearlie Oyster, a trained medical scribe. The creation of this record is based on the scribe's personal observations and the provider's statements to them. This document has been checked and approved by the attending provider.    I have reviewed the above documentation for accuracy and completeness, and I agree with the above.  Kelby Fam. Zyrell Carmean MD

## 2014-10-28 NOTE — Patient Instructions (Addendum)
..  [place] at [facility] Discharge Instructions  RECOMMENDATIONS MADE BY THE CONSULTANT AND ANY TEST RESULTS WILL BE SENT TO YOUR REFERRING PHYSICIAN.  Exam today per Dr. Youlanda Roys will receive a call for an appt for port a cath placement and biopsy of adrenal gland  Lupita Raider will call you to set up an appt for chemo teaching     Thank you for choosing [place] at [facility] to provide your oncology and hematology care.  To afford each patient quality time with our provider, please arrive at least 15 minutes before your scheduled appointment time.    You need to re-schedule your appointment should you arrive 10 or more minutes late.  We strive to give you quality time with our providers, and arriving late affects you and other patients whose appointments are after yours.  Also, if you no show three or more times for appointments you may be dismissed from the clinic at the providers discretion.     Again, thank you for choosing [facility].  Our hope is that these requests will decrease the amount of time that you wait before being seen by our physicians.       _____________________________________________________________  Should you have questions after your visit to [facility], please contact our office at [phone] between the hours of 8:30 a.m. and 4:30 p.m.  Voicemails left after 4:30 p.m. will not be returned until the following business day.  For prescription refill requests, have your pharmacy contact our office.

## 2014-10-31 ENCOUNTER — Other Ambulatory Visit (HOSPITAL_COMMUNITY): Payer: Self-pay | Admitting: Hematology & Oncology

## 2014-10-31 DIAGNOSIS — C3411 Malignant neoplasm of upper lobe, right bronchus or lung: Secondary | ICD-10-CM

## 2014-10-31 DIAGNOSIS — C3491 Malignant neoplasm of unspecified part of right bronchus or lung: Secondary | ICD-10-CM | POA: Insufficient documentation

## 2014-10-31 HISTORY — DX: Malignant neoplasm of unspecified part of right bronchus or lung: C34.91

## 2014-11-01 ENCOUNTER — Other Ambulatory Visit: Payer: Self-pay | Admitting: Radiology

## 2014-11-02 ENCOUNTER — Other Ambulatory Visit: Payer: Self-pay | Admitting: Physician Assistant

## 2014-11-03 ENCOUNTER — Inpatient Hospital Stay (HOSPITAL_COMMUNITY): Admission: RE | Admit: 2014-11-03 | Payer: Medicare Other | Source: Ambulatory Visit

## 2014-11-03 ENCOUNTER — Encounter (HOSPITAL_COMMUNITY): Payer: Self-pay

## 2014-11-03 ENCOUNTER — Other Ambulatory Visit (HOSPITAL_COMMUNITY): Payer: Self-pay | Admitting: *Deleted

## 2014-11-03 ENCOUNTER — Ambulatory Visit (HOSPITAL_COMMUNITY)
Admission: RE | Admit: 2014-11-03 | Discharge: 2014-11-03 | Disposition: A | Discharge status: Home / Self Care | Payer: Medicare Other | Source: Ambulatory Visit | Attending: Hematology & Oncology | Admitting: Hematology & Oncology

## 2014-11-03 DIAGNOSIS — E119 Type 2 diabetes mellitus without complications: Secondary | ICD-10-CM | POA: Diagnosis not present

## 2014-11-03 DIAGNOSIS — C3491 Malignant neoplasm of unspecified part of right bronchus or lung: Secondary | ICD-10-CM | POA: Diagnosis present

## 2014-11-03 DIAGNOSIS — C3411 Malignant neoplasm of upper lobe, right bronchus or lung: Secondary | ICD-10-CM

## 2014-11-03 LAB — CBC WITH DIFFERENTIAL/PLATELET
BASOS PCT: 0 % (ref 0–1)
Basophils Absolute: 0 10*3/uL (ref 0.0–0.1)
EOS PCT: 3 % (ref 0–5)
Eosinophils Absolute: 0.2 10*3/uL (ref 0.0–0.7)
HCT: 35.2 % — ABNORMAL LOW (ref 39.0–52.0)
Hemoglobin: 11.2 g/dL — ABNORMAL LOW (ref 13.0–17.0)
Lymphocytes Relative: 16 % (ref 12–46)
Lymphs Abs: 1.3 10*3/uL (ref 0.7–4.0)
MCH: 26.6 pg (ref 26.0–34.0)
MCHC: 31.8 g/dL (ref 30.0–36.0)
MCV: 83.6 fL (ref 78.0–100.0)
Monocytes Absolute: 0.7 10*3/uL (ref 0.1–1.0)
Monocytes Relative: 8 % (ref 3–12)
Neutro Abs: 6 10*3/uL (ref 1.7–7.7)
Neutrophils Relative %: 73 % (ref 43–77)
PLATELETS: 262 10*3/uL (ref 150–400)
RBC: 4.21 MIL/uL — AB (ref 4.22–5.81)
RDW: 14 % (ref 11.5–15.5)
WBC: 8.2 10*3/uL (ref 4.0–10.5)

## 2014-11-03 LAB — GLUCOSE, CAPILLARY: Glucose-Capillary: 102 mg/dL — ABNORMAL HIGH (ref 65–99)

## 2014-11-03 MED ORDER — CEFAZOLIN SODIUM-DEXTROSE 2-3 GM-% IV SOLR: 2.0000 g | INTRAVENOUS | Status: DC

## 2014-11-03 MED ORDER — SODIUM CHLORIDE 0.9 % IV SOLN
INTRAVENOUS | Status: DC
  Administered 2014-11-03: 08:00:00 via INTRAVENOUS

## 2014-11-03 NOTE — Progress Notes (Signed)
Patient scheduled today for left adrenal mass biopsy with known squamous cell carcinoma s/p EBUS. After reviewing PET and previous CT scans dated back to 2010 Dr. Anselm Pancoast does not feel this has changed and does not feel this needs to be biopsied today. We have discussed this with Dr. Whitney Muse who agrees and would still like the patient to get the port a catheter today. The patient is A&Ox3 and does not want a port. We have attempted to contact the family without success to discuss the port further. The biopsy is canceled today and the patient refuses a port placement so he will be discharged from short stay.   Tsosie Billing PA-C Interventional Radiology  11/03/14  9:35 AM

## 2014-11-08 ENCOUNTER — Ambulatory Visit (HOSPITAL_COMMUNITY)
Admission: RE | Admit: 2014-11-08 | Discharge: 2014-11-08 | Disposition: A | Discharge status: Home / Self Care | Payer: Medicare Other | Source: Ambulatory Visit | Attending: Oncology | Admitting: Oncology

## 2014-11-08 ENCOUNTER — Encounter (HOSPITAL_COMMUNITY): Payer: Self-pay

## 2014-11-08 DIAGNOSIS — C7970 Secondary malignant neoplasm of unspecified adrenal gland: Secondary | ICD-10-CM | POA: Diagnosis not present

## 2014-11-08 DIAGNOSIS — C3411 Malignant neoplasm of upper lobe, right bronchus or lung: Secondary | ICD-10-CM | POA: Insufficient documentation

## 2014-11-08 DIAGNOSIS — Z08 Encounter for follow-up examination after completed treatment for malignant neoplasm: Secondary | ICD-10-CM | POA: Insufficient documentation

## 2014-11-08 MED ORDER — IOHEXOL 300 MG/ML  SOLN
100.0000 mL | Freq: Once | INTRAMUSCULAR | Status: AC | PRN
Start: 2014-11-08 — End: 2014-11-08
  Administered 2014-11-08: 75 mL via INTRAVENOUS

## 2014-11-09 ENCOUNTER — Other Ambulatory Visit (HOSPITAL_COMMUNITY): Payer: Medicare Other

## 2014-11-11 ENCOUNTER — Telehealth (HOSPITAL_COMMUNITY): Payer: Self-pay | Admitting: *Deleted

## 2014-11-14 ENCOUNTER — Inpatient Hospital Stay (HOSPITAL_COMMUNITY): Payer: Medicare Other

## 2014-11-15 ENCOUNTER — Encounter (HOSPITAL_COMMUNITY): Payer: Self-pay | Admitting: *Deleted

## 2014-11-17 ENCOUNTER — Ambulatory Visit: Payer: Medicare Other | Admitting: Adult Health

## 2014-11-18 ENCOUNTER — Inpatient Hospital Stay (HOSPITAL_COMMUNITY): Payer: Medicare Other

## 2014-11-21 ENCOUNTER — Inpatient Hospital Stay (HOSPITAL_COMMUNITY): Payer: Medicare Other

## 2014-11-22 ENCOUNTER — Inpatient Hospital Stay (HOSPITAL_COMMUNITY): Payer: Medicare Other

## 2014-11-23 ENCOUNTER — Inpatient Hospital Stay (HOSPITAL_COMMUNITY): Payer: Medicare Other

## 2014-11-24 ENCOUNTER — Inpatient Hospital Stay (HOSPITAL_COMMUNITY): Payer: Medicare Other

## 2014-11-25 ENCOUNTER — Inpatient Hospital Stay (HOSPITAL_COMMUNITY): Payer: Medicare Other

## 2014-11-28 ENCOUNTER — Ambulatory Visit (HOSPITAL_COMMUNITY): Payer: Medicare Other

## 2014-11-28 ENCOUNTER — Inpatient Hospital Stay (HOSPITAL_COMMUNITY): Payer: Medicare Other

## 2014-12-02 ENCOUNTER — Encounter (HOSPITAL_COMMUNITY): Payer: Medicare Other | Attending: Oncology | Admitting: Oncology

## 2014-12-02 ENCOUNTER — Encounter (HOSPITAL_COMMUNITY): Payer: Self-pay | Admitting: Oncology

## 2014-12-02 ENCOUNTER — Ambulatory Visit (HOSPITAL_COMMUNITY): Payer: Medicare Other

## 2014-12-02 ENCOUNTER — Inpatient Hospital Stay (HOSPITAL_COMMUNITY): Payer: Medicare Other

## 2014-12-02 ENCOUNTER — Encounter (HOSPITAL_COMMUNITY): Payer: Self-pay | Admitting: Lab

## 2014-12-02 ENCOUNTER — Ambulatory Visit (HOSPITAL_COMMUNITY): Payer: Medicare Other | Admitting: Oncology

## 2014-12-02 ENCOUNTER — Ambulatory Visit (HOSPITAL_COMMUNITY): Payer: Medicare Other | Admitting: Hematology & Oncology

## 2014-12-02 VITALS — BP 126/86 | HR 78 | Temp 97.6°F | Resp 18 | Wt 194.8 lb

## 2014-12-02 DIAGNOSIS — C3411 Malignant neoplasm of upper lobe, right bronchus or lung: Secondary | ICD-10-CM | POA: Diagnosis present

## 2014-12-02 DIAGNOSIS — C3491 Malignant neoplasm of unspecified part of right bronchus or lung: Secondary | ICD-10-CM

## 2014-12-02 DIAGNOSIS — R918 Other nonspecific abnormal finding of lung field: Secondary | ICD-10-CM | POA: Insufficient documentation

## 2014-12-02 NOTE — Patient Instructions (Signed)
[place] at [facility] Discharge Instructions  RECOMMENDATIONS MADE BY THE CONSULTANT AND ANY TEST RESULTS WILL BE SENT TO YOUR REFERRING PHYSICIAN.  Exam complete by Kirby Crigler today Hospice referral, hospice will contact you.   Thank you for choosing [place] at [facility] to provide your oncology and hematology care.  To afford each patient quality time with our provider, please arrive at least 15 minutes before your scheduled appointment time.    You need to re-schedule your appointment should you arrive 10 or more minutes late.  We strive to give you quality time with our providers, and arriving late affects you and other patients whose appointments are after yours.  Also, if you no show three or more times for appointments you may be dismissed from the clinic at the providers discretion.     Again, thank you for choosing [facility].  Our hope is that these requests will decrease the amount of time that you wait before being seen by our physicians.       _____________________________________________________________  Should you have questions after your visit to [facility], please contact our office at [phone] between the hours of 8:30 a.m. and 4:30 p.m.  Voicemails left after 4:30 p.m. will not be returned until the following business day.  For prescription refill requests, have your pharmacy contact our office.

## 2014-12-02 NOTE — Progress Notes (Unsigned)
Referral sent to [facility].  Records faxed on 6/24

## 2014-12-02 NOTE — Assessment & Plan Note (Signed)
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  The patient has refused port placement and radiation therapy.   He is not interested in therapy.    I discussed Hospice with the patient.  He is agreeable to pursue this.  Family is supportive.  Referral made to [facility].  Patient is to return on a PRN basis.

## 2014-12-02 NOTE — Progress Notes (Signed)
Alejandro Curb, PA-C [address] [place] [address]  Squamous cell carcinoma of right lung  CURRENT THERAPY: None  INTERVAL HISTORY: Alejandro [name] 74 y.o. 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  The patient has refused port placement and radiation therapy.  Chart is reviewed.  Alejandro [name] is seen smiling in the exam room, accompanied by his brother, sister, and caregiver.    I reviewed the information that I know about Alejandro [name].  "What would you like [place] to do?"  He reports that he does not want any therapy.  Family is 100% on board with this plan and supportive of Alejandro [name]' decision.  With this in mind, I discussed the role of Hospice intervention.  He wants this.  He reports that he lives closer to [place] and they request [facility].  I will set this referral up.  We will see him back on a PRN basis.   Past Medical History  Diagnosis Date  . Mixed hyperlipidemia   . Essential hypertension, benign   . Schizophrenia   . Positive PPD     Treated  . DM2 (diabetes mellitus, type 2)   . Diverticulosis     Pancolonic   . Hemorrhoids   . PVC's (premature ventricular contractions)   . Syncope   . Mentally challenged   . History of GI diverticular bleed   . Lung mass   . 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     has No Known Allergies.  Current Outpatient Prescriptions on File Prior to Visit  Medication Sig Dispense Refill  . amLODipine (NORVASC) 5 MG tablet Take 5 mg by mouth daily.      Marland Kitchen aspirin (ASPIR-LOW) 81 MG EC tablet Take 81 mg by mouth daily.      . Cholecalciferol (VITAMIN D3) 2000 UNITS TABS Take 4,000 Units by mouth daily.     Marland Kitchen dutasteride (AVODART) 0.5 MG capsule Take 0.5 mg by mouth daily.      Marland Kitchen econazole nitrate 1 % cream Apply 1 application topically 2 (two) times daily.     . fish oil-omega-3 fatty acids 1000 MG capsule Take 1 g by mouth 2 (two) times daily.    . fluticasone (FLONASE) 50 MCG/ACT nasal spray Place 1 spray into the nose daily.     Marland Kitchen lisinopril-hydrochlorothiazide (PRINZIDE,ZESTORETIC) 20-12.5 MG per tablet Take 1 tablet by mouth daily.     Marland Kitchen lovastatin (MEVACOR) 40 MG tablet Take 40 mg by mouth at bedtime.     . meloxicam (MOBIC) 15 MG tablet Take 15 mg by mouth daily as needed for pain.     . metFORMIN (GLUCOPHAGE) 500 MG tablet Take 500 mg by mouth 2 (two) times daily with a meal.     . metoprolol succinate (TOPROL XL) 25 MG 24 hr  tablet Take 25 mg by mouth every morning.     . Multiple Vitamins-Minerals (MULTIVITAMIN WITH MINERALS) tablet Take 1 tablet by mouth daily.    Marland Kitchen omeprazole (PRILOSEC) 20 MG capsule Take 20 mg by mouth every morning.     . polyethylene glycol (MIRALAX / GLYCOLAX) packet Take 17 g by mouth See admin instructions. Mix 1 capful (17 gm) in 8 oz of water twice daily until regular bowel movements occur, then start once daily as needed for constipation.    . risperiDONE (RISPERDAL) 3 MG tablet Take 3 mg by mouth 2 (two) times daily.     . Tamsulosin HCl (FLOMAX) 0.4 MG CAPS Take 0.4 mg by mouth daily.     . traZODone (DESYREL) 150 MG tablet Take 150 mg by mouth at bedtime.      No current  facility-administered medications on file prior to visit.    Past Surgical History  Procedure Laterality Date  . Colonoscopy   07/11/2004     Rehman-Pancolonic diverticulosis/Small external hemorrhoids/ The colon was full of coffee-ground material, but no active bleeding  . Esophagogastroduodenoscopy  07/11/2004    Rehman-incomplete ring GEJ, Small sliding hiatal hernia/Bulbar duodenitis without stigmata of bleeding  . Givens capsule study  01/10/2005    Rehman-Few petechiae noted at involving gastric mucosa as well as duodenum and  jejunum/ the  surface of the small bowel mucosa could not be seen because of food   debris limiting the quality of this study.  . Colonoscopy  03/29/2008    Large mouth ascending colon and sigmoid colon diverticula which were frequent.  Otherwise, no polyps, masses, inflammatory changes or AVM seen. /Small internal hemorrhoids, otherwise normal retroflexed view of the rectum  . Arm surgery      left  . Esophagogastroduodenoscopy (egd) with esophageal dilation  04/17/2012    IRS:WNIOEVOJ ZENKER'S DIVERTICULUM OR PRIMARY ESOPHGEAL MOILITY DISORDER/MODERATE Erosive gastritis/MILD Duodenal inflammation   . Video bronchoscopy with endobronchial ultrasound N/A 10/24/2014    Procedure: VIDEO BRONCHOSCOPY WITH ENDOBRONCHIAL ULTRASOUND;  Surgeon: Melrose Nakayama, MD;  Location: [facility];  Service: Thoracic;  Laterality: N/A;    Denies any headaches, dizziness, double vision, fevers, chills, night sweats, nausea, vomiting, diarrhea, constipation, chest pain, heart palpitations, shortness of breath, blood in stool, black tarry stool, urinary pain, urinary burning, urinary frequency, hematuria.   PHYSICAL EXAMINATION  ECOG PERFORMANCE STATUS: 1 - Symptomatic but completely ambulatory  Filed Vitals:   12/02/14 0926  BP: 126/86  Pulse: 78  Temp: 97.6 F (36.4 C)  Resp: 18    GENERAL:alert, no distress, well nourished, well developed, comfortable, cooperative, obese  and smiling SKIN: skin color, texture, turgor are normal, no rashes or significant lesions HEAD: Normocephalic, No masses, lesions, tenderness or abnormalities EYES: normal, PERRLA, EOMI, Conjunctiva are pink and non-injected EARS: External ears normal OROPHARYNX:lips, buccal mucosa, and tongue normal and mucous membranes are moist  NECK: supple, trachea midline LYMPH:  not examined BREAST:not examined LUNGS: not examined HEART: not examined ABDOMEN:obese BACK: Back symmetric, no curvature. EXTREMITIES:less then 2 second capillary refill, no joint deformities, effusion, or inflammation, no skin discoloration, no cyanosis  NEURO: alert & oriented x 3 with fluent speech, no focal motor/sensory deficits, gait normal   LABORATORY DATA: CBC    Component Value Date/Time   WBC 8.2 11/03/2014 0745   RBC 4.21* 11/03/2014 0745   HGB 11.2* 11/03/2014 0745   HCT 35.2* 11/03/2014 0745   PLT 262 11/03/2014 0745   MCV 83.6 11/03/2014 0745  MCH 26.6 11/03/2014 0745   MCHC 31.8 11/03/2014 0745   RDW 14.0 11/03/2014 0745   LYMPHSABS 1.3 11/03/2014 0745   MONOABS 0.7 11/03/2014 0745   EOSABS 0.2 11/03/2014 0745   BASOSABS 0.0 11/03/2014 0745      Chemistry      Component Value Date/Time   NA 139 10/24/2014 1030   K 3.6 10/24/2014 1030   CL 101 10/24/2014 1030   CO2 29 10/24/2014 1030   BUN 9 10/24/2014 1030   CREATININE 0.96 10/24/2014 1030      Component Value Date/Time   CALCIUM 9.3 10/24/2014 1030   ALKPHOS 46 10/24/2014 1030   AST 14* 10/24/2014 1030   ALT 10* 10/24/2014 1030   BILITOT 0.7 10/24/2014 1030       RADIOGRAPHIC STUDIES:  Ct Head W Wo Contrast  2014-11-12   CLINICAL DATA:  74 year old male with recently diagnosed right apical lung cancer. Adrenal metastases. Staging. Subsequent encounter.  EXAM: CT HEAD WITHOUT AND WITH CONTRAST  TECHNIQUE: Contiguous axial images were obtained from the base of the skull through the vertex without and with intravenous contrast   CONTRAST:  51m OMNIPAQUE IOHEXOL 300 MG/ML  SOLN  COMPARISON:  None.  FINDINGS: No acute or suspicious osseous lesion identified. Visualized paranasal sinuses and mastoids are clear. Visualized orbits and scalp soft tissues are within normal limits.  Cerebral volume is within normal limits for age. Patchy and confluent cerebral white matter and basal ganglia hypodensity suggesting chronic small vessel ischemia. No ventriculomegaly. No acute intracranial hemorrhage identified. No midline shift, mass effect, or evidence of intracranial mass lesion. No evidence of cortically based acute infarction identified. No abnormal enhancement identified. Major intracranial vascular structures are enhancing.  IMPRESSION: 1.  No acute or metastatic intracranial abnormality. 2. Chronic small vessel ischemia.   Electronically Signed   By: HGenevie AnnM.D.   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  The patient has refused port placement and radiation therapy.   He is not interested in therapy.    I discussed Hospice with the patient.  He is agreeable to pursue this.  Family is supportive.  [facility]  Patient is to return on a PRN basis.     THERAPY PLAN:  Referral to Hospice.  All questions were answered. The patient knows to call the clinic with any problems, questions or concerns. We can certainly see the patient much sooner if necessary.  Patient and plan discussed with Dr. SAncil Linseyand she is in agreement with the aforementioned.   This note is electronically signed by: KDoy Mince6/24/2016 10:06 AM

## 2014-12-20 ENCOUNTER — Ambulatory Visit: Payer: Medicare Other | Admitting: Adult Health

## 2014-12-20 ENCOUNTER — Encounter (HOSPITAL_COMMUNITY): Payer: Self-pay | Admitting: Oncology

## 2015-01-13 ENCOUNTER — Ambulatory Visit: Payer: Medicare Other | Admitting: Adult Health

## 2015-01-26 ENCOUNTER — Ambulatory Visit (INDEPENDENT_AMBULATORY_CARE_PROVIDER_SITE_OTHER): Admitting: Adult Health

## 2015-01-26 ENCOUNTER — Encounter: Payer: Self-pay | Admitting: Adult Health

## 2015-01-26 ENCOUNTER — Ambulatory Visit (INDEPENDENT_AMBULATORY_CARE_PROVIDER_SITE_OTHER): Admitting: Internal Medicine

## 2015-01-26 VITALS — BP 112/78 | HR 79 | Temp 97.9°F | Ht 66.0 in | Wt 185.0 lb

## 2015-01-26 DIAGNOSIS — R918 Other nonspecific abnormal finding of lung field: Secondary | ICD-10-CM

## 2015-01-26 DIAGNOSIS — C3491 Malignant neoplasm of unspecified part of right bronchus or lung: Secondary | ICD-10-CM | POA: Diagnosis not present

## 2015-01-26 DIAGNOSIS — R0600 Dyspnea, unspecified: Secondary | ICD-10-CM

## 2015-01-26 DIAGNOSIS — R590 Localized enlarged lymph nodes: Secondary | ICD-10-CM

## 2015-01-26 DIAGNOSIS — J449 Chronic obstructive pulmonary disease, unspecified: Secondary | ICD-10-CM

## 2015-01-26 DIAGNOSIS — R0689 Other abnormalities of breathing: Secondary | ICD-10-CM

## 2015-01-26 NOTE — Assessment & Plan Note (Signed)
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

## 2015-01-26 NOTE — Progress Notes (Signed)
   Subjective:    Patient ID: Alejandro [name], male    DOB: [birth year], 74 y.o.   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  Dx with squamous cell lung carcinoma in May  He was seen by Oncology and has decided on not pursuing treatment  He is now under hospice care.  Says he is doing okay overall . No flare of cough or wheezing  Denies hemoptyiss , chest pain, orthopnea or edema  Says his breathing is doing ok .  PFT today shows  FEV1 69%, ratio 65, FVC 78%, no sign BD response. DLCO 66%.     Review of Systems Constitutional:   No  weight loss, night sweats,  Fevers, chills, fatigue, or  lassitude.  HEENT:   No headaches,  Difficulty swallowing,  Tooth/dental problems, or  Sore throat,                No sneezing, itching, ear ache, nasal congestion, post nasal drip,   CV:  No chest pain,  Orthopnea, PND, swelling in lower extremities, anasarca, dizziness, palpitations, syncope.   GI  No heartburn, indigestion, abdominal pain, nausea, vomiting, diarrhea, change in bowel habits, loss of appetite, bloody stools.   Resp: No shortness of breath with exertion or at rest.  No excess mucus, no productive cough,  No non-productive cough,  No coughing up of blood.  No change in color of mucus.  No wheezing.  No chest wall deformity  Skin: no rash or lesions.  GU: no dysuria, change in color of urine, no urgency or frequency.  No flank pain, no hematuria   MS:  No joint pain or swelling.  No decreased range of motion.  No back pain.          Objective:   Physical Exam GEN: A/Ox3; pleasant , NAD, elderly    HEENT:  [place]/AT,  EACs-clear, TMs-wnl, NOSE-clear, THROAT-clear, no lesions, no postnasal drip or exudate noted.   NECK:  Supple w/ fair ROM; no JVD; normal carotid impulses w/o bruits; no thyromegaly or nodules palpated; no lymphadenopathy.  RESP  Clear  P & A; w/o, wheezes/ rales/ or rhonchi.no accessory muscle use, no dullness to percussion  CARD:  RRR, no m/r/g  , no peripheral edema, pulses intact, no cyanosis or clubbing.  GI:   Soft & nt; nml bowel sounds; no organomegaly or masses detected.  Musco: Warm bil, no deformities or joint swelling noted.   Neuro: alert, no focal deficits noted.    Skin: Warm, no lesions or rashes         Assessment & Plan:

## 2015-01-26 NOTE — Assessment & Plan Note (Signed)
Stage IIIA squamous cell carcinoma - pt with evaluation with Oncology  He declined treatment  Under hospice care  Appears to be doing okay   Plan  Cont with hospice

## 2015-01-26 NOTE — Patient Instructions (Addendum)
Continue on current regimen .  Follow up Dr. Chase Caller in 4 months and As needed

## 2015-01-26 NOTE — Progress Notes (Signed)
PFT done today. 

## 2015-02-10 ENCOUNTER — Other Ambulatory Visit (HOSPITAL_COMMUNITY): Payer: Self-pay | Admitting: Oncology

## 2015-02-10 DIAGNOSIS — J449 Chronic obstructive pulmonary disease, unspecified: Secondary | ICD-10-CM

## 2015-02-10 DIAGNOSIS — C3491 Malignant neoplasm of unspecified part of right bronchus or lung: Secondary | ICD-10-CM

## 2015-02-10 MED ORDER — IPRATROPIUM-ALBUTEROL 0.5-2.5 (3) MG/3ML IN SOLN: 3.0000 mL | RESPIRATORY_TRACT | Status: AC | PRN

## 2015-02-20 ENCOUNTER — Encounter: Payer: Self-pay | Admitting: Gastroenterology

## 2015-04-11 DEATH — deceased

## 2016-01-26 LAB — PULMONARY FUNCTION TEST
DL/VA % pred: 110 %
DL/VA: 4.92 ml/min/mmHg/L
DLCO unc % pred: 66 %
DLCO unc: 19.84 ml/min/mmHg
FEF 25-75 POST: 1.26 L/s
FEF 25-75 PRE: 1.25 L/s
FEF2575-%Change-Post: 0 %
FEF2575-%PRED-POST: 60 %
FEF2575-%Pred-Pre: 60 %
FEV1-%Change-Post: 2 %
FEV1-%PRED-POST: 71 %
FEV1-%PRED-PRE: 69 %
FEV1-POST: 1.81 L
FEV1-PRE: 1.75 L
FEV1FVC-%Change-Post: 7 %
FEV1FVC-%PRED-PRE: 86 %
FEV6-%CHANGE-POST: -1 %
FEV6-%Pred-Post: 78 %
FEV6-%Pred-Pre: 79 %
FEV6-Post: 2.54 L
FEV6-Pre: 2.59 L
FEV6FVC-%Change-Post: 0 %
FEV6FVC-%Pred-Post: 105 %
FEV6FVC-%Pred-Pre: 105 %
FVC-%CHANGE-POST: -4 %
FVC-%PRED-PRE: 78 %
FVC-%Pred-Post: 75 %
FVC-POST: 2.6 L
FVC-Pre: 2.71 L
POST FEV1/FVC RATIO: 69 %
PRE FEV1/FVC RATIO: 65 %
Post FEV6/FVC ratio: 99 %
Pre FEV6/FVC Ratio: 99 %

## 2016-06-22 IMAGING — CT CT ANGIO CHEST
2 of 6 series · 6 of 36 positions shown · IV contrast (Omnipaque 300)
Comparison: CT scan of the chest dated 09/09/2014 and chest x-ray
dated 09/30/2014

CLINICAL DATA: Right-sided chest pain since this morning.

EXAM:
CT ANGIOGRAPHY CHEST WITH CONTRAST
TECHNIQUE: Multidetector CT imaging of the chest was performed using the
standard protocol during bolus administration of intravenous
contrast. Multiplanar CT image reconstructions and MIPs were
obtained to evaluate the vascular anatomy.
CONTRAST:  100mL OMNIPAQUE IOHEXOL 350 MG/ML SOLN

[Series 7: pe 3.0 b40f · axial · 0.82mm/px · z∈[-392,-198]mm · 5 of 99 slices shown]
[im 17/99  lung]
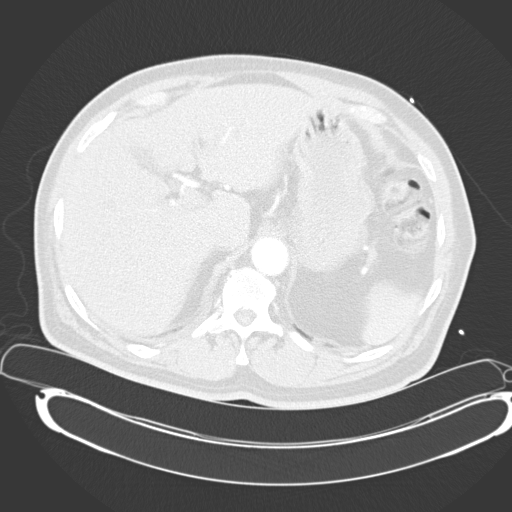
[im 33/99  mediastinal]
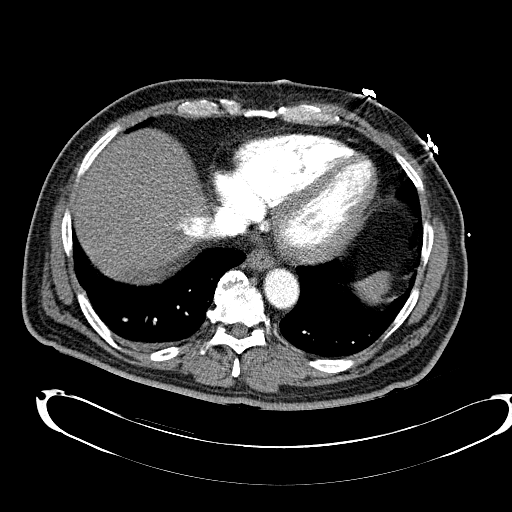
[im 50/99  lung]
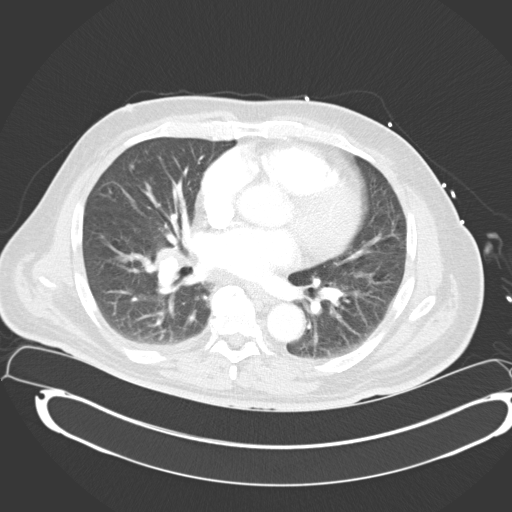
[im 66/99  mediastinal]
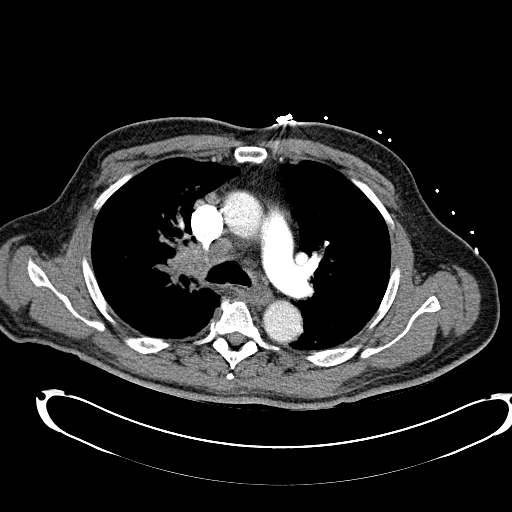
[im 82/99  lung]
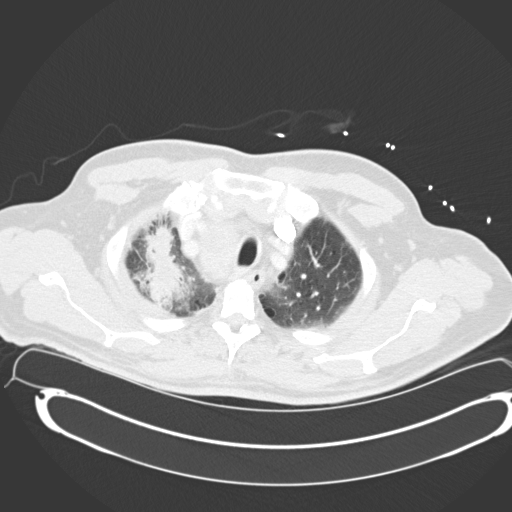

[Series 9: mpr coronal pe 3mm · coronal · 0.60mm/px · 1 of 91 slices shown]
[im 46/91  mediastinal]
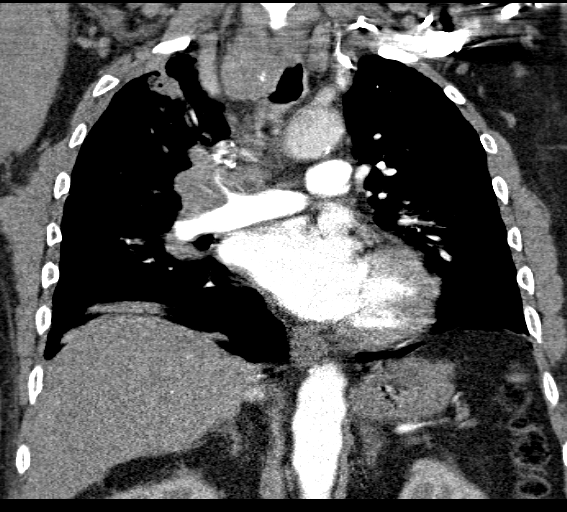

[6 of 36 positions shown; findings below may reference images not displayed]

FINDINGS: There has been marked progression of the articular mass in the right
lung apex, now all less well-defined and now measuring 5.9 x 2.8 cm,
increased from 4.2 by 1.6 cm.

There is increased right hilar adenopathy now measuring 3.9 x
cm, increased from 3.3 x 2.9 cm. Precarinal lymph node is increased
from 12 mm to 14 mm. Right hilar adenopathy has increased
inferiorly.

There is marked enlargement of the right lobe of the thyroid gland,
measuring 4.8 x 3.4 cm, increased from 4.4 x 3.2 cm.

New tiny right effusion.  New  cardiomegaly.

2.1 cm nodule in the left adrenal gland is relatively dense and
could represent metastatic disease.

Multiple stones in the gallbladder.

Review of the MIP images confirms the above findings.
IMPRESSION: 1. Marked increase in the right apical lung mass with new hazy
density in the surrounding lung. Increased hilar and mediastinal
adenopathy.
2. New cardiomegaly.
3. Interval enlargement of the right lobe of the thyroid gland.
4. New tiny right effusion.

## 2020-01-25 ENCOUNTER — Other Ambulatory Visit: Payer: Self-pay
